# Patient Record
Sex: Female | Born: 1980 | Hispanic: No | Marital: Married | Smoking: Never smoker
Health system: Southern US, Community
[De-identification: ages and names within clinical notes are randomized; demographics above are authoritative.]

## PROBLEM LIST (undated history)

## (undated) DIAGNOSIS — C8590 Non-Hodgkin lymphoma, unspecified, unspecified site: Secondary | ICD-10-CM

## (undated) DIAGNOSIS — C801 Malignant (primary) neoplasm, unspecified: Secondary | ICD-10-CM

## (undated) HISTORY — PX: TUMOR REMOVAL: SHX12

---

## 1999-08-13 ENCOUNTER — Other Ambulatory Visit: Admission: RE | Admit: 1999-08-13 | Discharge: 1999-08-13 | Payer: Self-pay | Admitting: Family Medicine

## 2008-02-13 ENCOUNTER — Emergency Department: Payer: Self-pay | Admitting: Emergency Medicine

## 2008-04-09 ENCOUNTER — Emergency Department: Payer: Self-pay | Admitting: Emergency Medicine

## 2009-07-23 ENCOUNTER — Emergency Department: Payer: Self-pay | Admitting: Emergency Medicine

## 2010-02-06 ENCOUNTER — Emergency Department: Payer: Self-pay | Admitting: Emergency Medicine

## 2010-09-22 ENCOUNTER — Emergency Department (HOSPITAL_COMMUNITY)
Admission: EM | Admit: 2010-09-22 | Discharge: 2010-09-22 | Payer: Self-pay | Source: Home / Self Care | Admitting: Emergency Medicine

## 2010-10-23 ENCOUNTER — Encounter: Payer: Self-pay | Admitting: Obstetrics & Gynecology

## 2010-10-23 ENCOUNTER — Ambulatory Visit
Admission: RE | Admit: 2010-10-23 | Discharge: 2010-10-23 | Payer: Self-pay | Source: Home / Self Care | Attending: Obstetrics & Gynecology | Admitting: Obstetrics & Gynecology

## 2010-10-23 LAB — CONVERTED CEMR LAB: TSH: 3.21 microintl units/mL (ref 0.350–4.500)

## 2010-12-26 ENCOUNTER — Ambulatory Visit: Payer: Self-pay | Admitting: Obstetrics & Gynecology

## 2011-01-17 ENCOUNTER — Ambulatory Visit: Payer: Self-pay | Admitting: Obstetrics & Gynecology

## 2011-01-22 ENCOUNTER — Ambulatory Visit: Payer: Self-pay | Admitting: Obstetrics and Gynecology

## 2012-02-18 ENCOUNTER — Emergency Department: Payer: Self-pay | Admitting: *Deleted

## 2012-02-18 LAB — CBC
HGB: 9.7 g/dL — ABNORMAL LOW (ref 12.0–16.0)
MCHC: 30.9 g/dL — ABNORMAL LOW (ref 32.0–36.0)
MCV: 70 fL — ABNORMAL LOW (ref 80–100)
RBC: 4.51 10*6/uL (ref 3.80–5.20)
RDW: 15.7 % — ABNORMAL HIGH (ref 11.5–14.5)
WBC: 8.3 10*3/uL (ref 3.6–11.0)

## 2012-02-18 LAB — URINALYSIS, COMPLETE
Bilirubin,UR: NEGATIVE
Glucose,UR: NEGATIVE mg/dL (ref 0–75)
Hyaline Cast: 1
Nitrite: NEGATIVE
Specific Gravity: 1.027 (ref 1.003–1.030)
WBC UR: 6 /HPF (ref 0–5)

## 2012-02-18 LAB — COMPREHENSIVE METABOLIC PANEL
Albumin: 3.2 g/dL — ABNORMAL LOW (ref 3.4–5.0)
Anion Gap: 7 (ref 7–16)
Bilirubin,Total: 0.2 mg/dL (ref 0.2–1.0)
Co2: 28 mmol/L (ref 21–32)
Creatinine: 0.82 mg/dL (ref 0.60–1.30)
EGFR (Non-African Amer.): >60
Osmolality: 277 (ref 275–301)
Potassium: 3.9 mmol/L (ref 3.5–5.1)
SGPT (ALT): 16 U/L
Sodium: 139 mmol/L (ref 136–145)

## 2012-03-15 ENCOUNTER — Emergency Department: Payer: Self-pay | Admitting: Emergency Medicine

## 2012-03-15 LAB — BASIC METABOLIC PANEL
Anion Gap: 12 (ref 7–16)
Calcium, Total: 9.1 mg/dL (ref 8.5–10.1)
Co2: 23 mmol/L (ref 21–32)
EGFR (Non-African Amer.): >60
Sodium: 140 mmol/L (ref 136–145)

## 2012-03-15 LAB — CBC WITH DIFFERENTIAL/PLATELET
Basophil #: 0.1 10*3/uL (ref 0.0–0.1)
Basophil %: 0.7 %
Eosinophil #: 0.1 10*3/uL (ref 0.0–0.7)
Eosinophil %: 1.5 %
HCT: 33.8 % — ABNORMAL LOW (ref 35.0–47.0)
HGB: 10.7 g/dL — ABNORMAL LOW (ref 12.0–16.0)
Lymphocyte #: 2.3 10*3/uL (ref 1.0–3.6)
Lymphocyte %: 24.4 %
MCV: 68 fL — ABNORMAL LOW (ref 80–100)
Monocyte #: 0.8 x10 3/mm (ref 0.2–0.9)
Monocyte %: 8.8 %
Neutrophil #: 6.1 10*3/uL (ref 1.4–6.5)
Neutrophil %: 64.6 %
Platelet: 346 10*3/uL (ref 150–440)
RDW: 16.7 % — ABNORMAL HIGH (ref 11.5–14.5)
WBC: 9.4 10*3/uL (ref 3.6–11.0)

## 2012-03-21 LAB — CULTURE, BLOOD (SINGLE)

## 2012-10-12 ENCOUNTER — Emergency Department: Payer: Self-pay | Admitting: Emergency Medicine

## 2013-05-12 ENCOUNTER — Inpatient Hospital Stay: Payer: Self-pay | Admitting: Internal Medicine

## 2013-05-12 LAB — CBC
HGB: 11 g/dL — ABNORMAL LOW (ref 12.0–16.0)
MCH: 24.6 pg — ABNORMAL LOW (ref 26.0–34.0)
WBC: 1.9 10*3/uL — CL (ref 3.6–11.0)

## 2013-05-12 LAB — BASIC METABOLIC PANEL
Anion Gap: 7 (ref 7–16)
BUN: 12 mg/dL (ref 7–18)
Co2: 25 mmol/L (ref 21–32)
Creatinine: 0.71 mg/dL (ref 0.60–1.30)
EGFR (African American): >60
EGFR (Non-African Amer.): >60
Glucose: 95 mg/dL (ref 65–99)
Potassium: 4 mmol/L (ref 3.5–5.1)
Sodium: 138 mmol/L (ref 136–145)

## 2013-05-12 LAB — CK TOTAL AND CKMB (NOT AT ARMC): CK-MB: <0.5 ng/mL — ABNORMAL LOW (ref 0.5–3.6)

## 2013-05-13 DIAGNOSIS — I499 Cardiac arrhythmia, unspecified: Secondary | ICD-10-CM

## 2013-05-13 LAB — CBC WITH DIFFERENTIAL/PLATELET
Basophil #: 0 10*3/uL (ref 0.0–0.1)
Basophil #: 0 10*3/uL (ref 0.0–0.1)
Basophil %: 0.8 %
Eosinophil #: 0.1 10*3/uL (ref 0.0–0.7)
Eosinophil #: 0.1 10*3/uL (ref 0.0–0.7)
Eosinophil %: 9 %
Eosinophil %: 10 %
HGB: 9.1 g/dL — ABNORMAL LOW (ref 12.0–16.0)
HGB: 8.8 g/dL — ABNORMAL LOW (ref 12.0–16.0)
Lymphocyte #: 0.2 10*3/uL — ABNORMAL LOW (ref 1.0–3.6)
Lymphocyte %: 27.2 %
Lymphocyte %: 24.7 %
MCH: 24.7 pg — ABNORMAL LOW (ref 26.0–34.0)
MCH: 24.7 pg — ABNORMAL LOW (ref 26.0–34.0)
MCHC: 33 g/dL (ref 32.0–36.0)
MCV: 75 fL — ABNORMAL LOW (ref 80–100)
MCV: 75 fL — ABNORMAL LOW (ref 80–100)
Monocyte #: 0 x10 3/mm — ABNORMAL LOW (ref 0.2–0.9)
Monocyte %: 3.3 %
Monocyte %: 1.9 %
Neutrophil #: 0.5 10*3/uL — ABNORMAL LOW (ref 1.4–6.5)
Neutrophil #: 0.5 10*3/uL — ABNORMAL LOW (ref 1.4–6.5)
Neutrophil %: 62.5 %
Platelet: 235 10*3/uL (ref 150–440)
Platelet: 207 10*3/uL (ref 150–440)
RBC: 3.57 10*6/uL — ABNORMAL LOW (ref 3.80–5.20)
WBC: 0.9 10*3/uL — CL (ref 3.6–11.0)
WBC: 0.8 10*3/uL — CL (ref 3.6–11.0)

## 2013-05-13 LAB — COMPREHENSIVE METABOLIC PANEL
Albumin: 2.5 g/dL — ABNORMAL LOW (ref 3.4–5.0)
Alkaline Phosphatase: 91 U/L (ref 50–136)
Anion Gap: 8 (ref 7–16)
Bilirubin,Total: 0.5 mg/dL (ref 0.2–1.0)
Calcium, Total: 8.7 mg/dL (ref 8.5–10.1)
Co2: 24 mmol/L (ref 21–32)
EGFR (Non-African Amer.): >60
Glucose: 125 mg/dL — ABNORMAL HIGH (ref 65–99)
Potassium: 3.5 mmol/L (ref 3.5–5.1)
Sodium: 135 mmol/L — ABNORMAL LOW (ref 136–145)

## 2013-05-13 LAB — TSH: Thyroid Stimulating Horm: 3.76 u[IU]/mL

## 2013-05-13 LAB — MAGNESIUM: Magnesium: 1.6 mg/dL — ABNORMAL LOW

## 2013-05-13 LAB — VANCOMYCIN, TROUGH: Vancomycin, Trough: 15 ug/mL (ref 10–20)

## 2013-05-14 LAB — CBC WITH DIFFERENTIAL/PLATELET
Basophil %: 1.6 %
HCT: 25.7 % — ABNORMAL LOW (ref 35.0–47.0)
Lymphocyte #: 0.2 10*3/uL — ABNORMAL LOW (ref 1.0–3.6)
Lymphocyte %: 25.2 %
MCHC: 33.3 g/dL (ref 32.0–36.0)
Monocyte %: 2.4 %
Neutrophil #: 0.5 10*3/uL — ABNORMAL LOW (ref 1.4–6.5)
Neutrophil %: 58.4 %
Platelet: 218 10*3/uL (ref 150–440)
RBC: 3.47 10*6/uL — ABNORMAL LOW (ref 3.80–5.20)
WBC: 0.8 10*3/uL — CL (ref 3.6–11.0)

## 2013-05-14 LAB — VANCOMYCIN, TROUGH: Vancomycin, Trough: 21 ug/mL (ref 10–20)

## 2013-05-15 LAB — CBC WITH DIFFERENTIAL/PLATELET
Basophil #: 0 10*3/uL (ref 0.0–0.1)
HCT: 29.6 % — ABNORMAL LOW (ref 35.0–47.0)
Lymphocyte #: 0.2 10*3/uL — ABNORMAL LOW (ref 1.0–3.6)
Lymphocyte %: 26.5 %
MCH: 24 pg — ABNORMAL LOW (ref 26.0–34.0)
MCV: 74 fL — ABNORMAL LOW (ref 80–100)
Neutrophil #: 0.5 10*3/uL — ABNORMAL LOW (ref 1.4–6.5)
RBC: 3.99 10*6/uL (ref 3.80–5.20)
RDW: 15.5 % — ABNORMAL HIGH (ref 11.5–14.5)
WBC: 0.8 10*3/uL — CL (ref 3.6–11.0)

## 2013-05-16 LAB — CBC WITH DIFFERENTIAL/PLATELET
Basophil #: 0 10*3/uL (ref 0.0–0.1)
Basophil %: 1.5 %
Eosinophil #: 0.1 10*3/uL (ref 0.0–0.7)
Eosinophil %: 5.6 %
HGB: 8.3 g/dL — ABNORMAL LOW (ref 12.0–16.0)
Lymphocyte #: 0.2 10*3/uL — ABNORMAL LOW (ref 1.0–3.6)
Lymphocyte %: 23.5 %
MCH: 24.5 pg — ABNORMAL LOW (ref 26.0–34.0)
MCV: 74 fL — ABNORMAL LOW (ref 80–100)
Neutrophil %: 53.1 %
Platelet: 158 10*3/uL (ref 150–440)
RBC: 3.4 10*6/uL — ABNORMAL LOW (ref 3.80–5.20)
RDW: 15.1 % — ABNORMAL HIGH (ref 11.5–14.5)
WBC: 1 10*3/uL — CL (ref 3.6–11.0)

## 2013-05-16 LAB — CLOSTRIDIUM DIFFICILE BY PCR

## 2013-05-16 LAB — CREATININE, SERUM
EGFR (African American): >60
EGFR (Non-African Amer.): >60

## 2013-05-17 LAB — CBC WITH DIFFERENTIAL/PLATELET
Eosinophil #: 0 10*3/uL (ref 0.0–0.7)
HGB: 9.3 g/dL — ABNORMAL LOW (ref 12.0–16.0)
Lymphocyte #: 0.3 10*3/uL — ABNORMAL LOW (ref 1.0–3.6)
MCH: 25.1 pg — ABNORMAL LOW (ref 26.0–34.0)
Platelet: 115 10*3/uL — ABNORMAL LOW (ref 150–440)
WBC: 1.4 10*3/uL — CL (ref 3.6–11.0)

## 2013-05-17 LAB — CREATININE, SERUM
Creatinine: 1.79 mg/dL — ABNORMAL HIGH (ref 0.60–1.30)
EGFR (African American): 42 — ABNORMAL LOW
EGFR (Non-African Amer.): 36 — ABNORMAL LOW

## 2013-05-17 LAB — CULTURE, BLOOD (SINGLE)

## 2013-05-18 LAB — CBC WITH DIFFERENTIAL/PLATELET
Basophil #: 0 10*3/uL (ref 0.0–0.1)
Eosinophil #: 0.1 10*3/uL (ref 0.0–0.7)
Eosinophil %: 3 %
HGB: 9.7 g/dL — ABNORMAL LOW (ref 12.0–16.0)
Lymphocyte %: 12.6 %
Lymphocytes: 15 %
MCH: 24.7 pg — ABNORMAL LOW (ref 26.0–34.0)
MCHC: 33.2 g/dL (ref 32.0–36.0)
MCHC: 33.4 g/dL (ref 32.0–36.0)
MCV: 75 fL — ABNORMAL LOW (ref 80–100)
Monocyte #: 1.3 x10 3/mm — ABNORMAL HIGH (ref 0.2–0.9)
Monocyte %: 56.2 %
Monocytes: 46 %
Platelet: 70 10*3/uL — ABNORMAL LOW (ref 150–440)
RBC: 3.78 10*6/uL — ABNORMAL LOW (ref 3.80–5.20)
RDW: 15.7 % — ABNORMAL HIGH (ref 11.5–14.5)
RDW: 15.5 % — ABNORMAL HIGH (ref 11.5–14.5)
WBC: 2.2 10*3/uL — ABNORMAL LOW (ref 3.6–11.0)
WBC: 2.3 10*3/uL — ABNORMAL LOW (ref 3.6–11.0)

## 2013-05-18 LAB — LACTATE DEHYDROGENASE: LDH: 385 U/L — ABNORMAL HIGH (ref 81–246)

## 2013-05-18 LAB — COMPREHENSIVE METABOLIC PANEL
Albumin: 2.3 g/dL — ABNORMAL LOW (ref 3.4–5.0)
Alkaline Phosphatase: 171 U/L — ABNORMAL HIGH (ref 50–136)
Anion Gap: 9 (ref 7–16)
Calcium, Total: 9.3 mg/dL (ref 8.5–10.1)
Chloride: 105 mmol/L (ref 98–107)
Co2: 26 mmol/L (ref 21–32)
EGFR (African American): 30 — ABNORMAL LOW
EGFR (Non-African Amer.): 26 — ABNORMAL LOW
SGOT(AST): 66 U/L — ABNORMAL HIGH (ref 15–37)
SGPT (ALT): 45 U/L (ref 12–78)

## 2013-05-18 LAB — CREATININE, SERUM
Creatinine: 2.31 mg/dL — ABNORMAL HIGH (ref 0.60–1.30)
EGFR (African American): 31 — ABNORMAL LOW
EGFR (Non-African Amer.): 27 — ABNORMAL LOW

## 2013-05-19 LAB — CBC WITH DIFFERENTIAL/PLATELET
Basophil #: 0 10*3/uL (ref 0.0–0.1)
HGB: 9.7 g/dL — ABNORMAL LOW (ref 12.0–16.0)
Lymphocyte #: 0.4 10*3/uL — ABNORMAL LOW (ref 1.0–3.6)
Lymphocyte %: 17.1 %
MCHC: 33.5 g/dL (ref 32.0–36.0)
MCV: 74 fL — ABNORMAL LOW (ref 80–100)
Monocyte #: 1.4 x10 3/mm — ABNORMAL HIGH (ref 0.2–0.9)
Monocyte %: 55.5 %
Platelet: 81 10*3/uL — ABNORMAL LOW (ref 150–440)
RBC: 3.91 10*6/uL (ref 3.80–5.20)
RDW: 15.4 % — ABNORMAL HIGH (ref 11.5–14.5)
WBC: 2.6 10*3/uL — ABNORMAL LOW (ref 3.6–11.0)

## 2013-05-19 LAB — BASIC METABOLIC PANEL
Anion Gap: 6 — ABNORMAL LOW (ref 7–16)
BUN: 10 mg/dL (ref 7–18)
Chloride: 108 mmol/L — ABNORMAL HIGH (ref 98–107)
Co2: 28 mmol/L (ref 21–32)
Creatinine: 2.45 mg/dL — ABNORMAL HIGH (ref 0.60–1.30)
Glucose: 107 mg/dL — ABNORMAL HIGH (ref 65–99)
Osmolality: 283 (ref 275–301)
Potassium: 3.8 mmol/L (ref 3.5–5.1)

## 2013-05-20 ENCOUNTER — Ambulatory Visit: Payer: Self-pay | Admitting: Oncology

## 2013-05-20 LAB — CBC WITH DIFFERENTIAL/PLATELET
HCT: 27.3 % — ABNORMAL LOW (ref 35.0–47.0)
HGB: 9.1 g/dL — ABNORMAL LOW (ref 12.0–16.0)
Lymphocytes: 12 %
MCHC: 33.3 g/dL (ref 32.0–36.0)
MCV: 74 fL — ABNORMAL LOW (ref 80–100)
Monocytes: 54 %
Myelocyte: 1 %
Platelet: 96 10*3/uL — ABNORMAL LOW (ref 150–440)
RBC: 3.71 10*6/uL — ABNORMAL LOW (ref 3.80–5.20)
RDW: 15.4 % — ABNORMAL HIGH (ref 11.5–14.5)
WBC: 2.6 10*3/uL — ABNORMAL LOW (ref 3.6–11.0)

## 2013-05-20 LAB — BASIC METABOLIC PANEL
Co2: 24 mmol/L (ref 21–32)
Creatinine: 2.23 mg/dL — ABNORMAL HIGH (ref 0.60–1.30)
EGFR (Non-African Amer.): 28 — ABNORMAL LOW
Osmolality: 282 (ref 275–301)
Sodium: 141 mmol/L (ref 136–145)

## 2013-05-21 LAB — CBC WITH DIFFERENTIAL/PLATELET
Bands: 1 %
HGB: 9.2 g/dL — ABNORMAL LOW (ref 12.0–16.0)
Lymphocytes: 27 %
MCH: 24.8 pg — ABNORMAL LOW (ref 26.0–34.0)
Monocytes: 30 %
Platelet: 135 10*3/uL — ABNORMAL LOW (ref 150–440)
Segmented Neutrophils: 42 %
WBC: 2.6 10*3/uL — ABNORMAL LOW (ref 3.6–11.0)

## 2013-05-21 LAB — COMPREHENSIVE METABOLIC PANEL
Alkaline Phosphatase: 196 U/L — ABNORMAL HIGH (ref 50–136)
Anion Gap: 8 (ref 7–16)
BUN: 10 mg/dL (ref 7–18)
Chloride: 110 mmol/L — ABNORMAL HIGH (ref 98–107)
Co2: 24 mmol/L (ref 21–32)
EGFR (African American): 30 — ABNORMAL LOW
EGFR (Non-African Amer.): 26 — ABNORMAL LOW
Glucose: 128 mg/dL — ABNORMAL HIGH (ref 65–99)
Osmolality: 284 (ref 275–301)
Potassium: 3.7 mmol/L (ref 3.5–5.1)
SGOT(AST): 85 U/L — ABNORMAL HIGH (ref 15–37)

## 2013-05-22 LAB — CBC WITH DIFFERENTIAL/PLATELET
Basophil #: 0 10*3/uL (ref 0.0–0.1)
Eosinophil #: 0.1 10*3/uL (ref 0.0–0.7)
Lymphocyte #: 0.3 10*3/uL — ABNORMAL LOW (ref 1.0–3.6)
MCH: 24.8 pg — ABNORMAL LOW (ref 26.0–34.0)
MCV: 74 fL — ABNORMAL LOW (ref 80–100)
Monocyte #: 1.4 x10 3/mm — ABNORMAL HIGH (ref 0.2–0.9)
Monocyte %: 49.3 %
Neutrophil #: 1 10*3/uL — ABNORMAL LOW (ref 1.4–6.5)
Neutrophil %: 36.1 %
Platelet: 170 10*3/uL (ref 150–440)

## 2013-05-22 LAB — BASIC METABOLIC PANEL
Anion Gap: 6 — ABNORMAL LOW (ref 7–16)
Calcium, Total: 9 mg/dL (ref 8.5–10.1)
Chloride: 110 mmol/L — ABNORMAL HIGH (ref 98–107)
Sodium: 142 mmol/L (ref 136–145)

## 2013-05-23 LAB — BASIC METABOLIC PANEL
Anion Gap: 6 — ABNORMAL LOW (ref 7–16)
BUN: 7 mg/dL (ref 7–18)
Calcium, Total: 8.7 mg/dL (ref 8.5–10.1)
Co2: 25 mmol/L (ref 21–32)
Creatinine: 2.08 mg/dL — ABNORMAL HIGH (ref 0.60–1.30)
EGFR (African American): 35 — ABNORMAL LOW
EGFR (Non-African Amer.): 30 — ABNORMAL LOW
Glucose: 126 mg/dL — ABNORMAL HIGH (ref 65–99)
Osmolality: 279 (ref 275–301)
Potassium: 3.5 mmol/L (ref 3.5–5.1)
Sodium: 140 mmol/L (ref 136–145)

## 2013-05-26 ENCOUNTER — Other Ambulatory Visit: Payer: Self-pay | Admitting: Internal Medicine

## 2013-05-26 LAB — BASIC METABOLIC PANEL
BUN: 7 mg/dL (ref 7–18)
Calcium, Total: 9.3 mg/dL (ref 8.5–10.1)
Co2: 25 mmol/L (ref 21–32)
Creatinine: 1.38 mg/dL — ABNORMAL HIGH (ref 0.60–1.30)
EGFR (African American): 58 — ABNORMAL LOW
EGFR (Non-African Amer.): 50 — ABNORMAL LOW
Osmolality: 275 (ref 275–301)
Potassium: 3.5 mmol/L (ref 3.5–5.1)
Sodium: 139 mmol/L (ref 136–145)

## 2013-06-20 ENCOUNTER — Ambulatory Visit: Payer: Self-pay | Admitting: Oncology

## 2013-07-28 ENCOUNTER — Encounter (HOSPITAL_COMMUNITY): Payer: Self-pay | Admitting: Emergency Medicine

## 2013-07-28 ENCOUNTER — Emergency Department (HOSPITAL_COMMUNITY)
Admission: EM | Admit: 2013-07-28 | Discharge: 2013-07-28 | Disposition: A | Discharge status: Home / Self Care | Payer: Medicaid Other | Source: Home / Self Care

## 2013-07-28 DIAGNOSIS — H109 Unspecified conjunctivitis: Secondary | ICD-10-CM

## 2013-07-28 DIAGNOSIS — J069 Acute upper respiratory infection, unspecified: Secondary | ICD-10-CM

## 2013-07-28 HISTORY — DX: Malignant (primary) neoplasm, unspecified: C80.1

## 2013-07-28 HISTORY — DX: Non-Hodgkin lymphoma, unspecified, unspecified site: C85.90

## 2013-07-28 MED ORDER — TETRACAINE HCL 0.5 % OP SOLN
OPHTHALMIC | Status: AC
  Filled 2013-07-28: qty 2

## 2013-07-28 MED ORDER — POLYMYXIN B-TRIMETHOPRIM 10000-0.1 UNIT/ML-% OP SOLN: 1.0000 [drp] | OPHTHALMIC | Status: DC

## 2013-07-28 MED ORDER — KETOTIFEN FUMARATE 0.025 % OP SOLN: 1.0000 [drp] | Freq: Two times a day (BID) | OPHTHALMIC | Status: DC

## 2013-07-28 NOTE — ED Notes (Signed)
Discharged by Douglass Rivers

## 2013-07-28 NOTE — ED Notes (Signed)
Reports runny nose, cough and congestion for several days prior to left eye turning red (Tuesday).  Since then redness has worsened, swelling, pain, redness and drainage.

## 2013-07-28 NOTE — ED Notes (Signed)
Eye box and tetracaine at bedside 

## 2013-07-28 NOTE — ED Provider Notes (Signed)
CSN: [id number]     Arrival date & time 07/28/13  1022 History   First MD Initiated Contact with Patient 07/28/13 1149     Chief Complaint  Patient presents with  . Conjunctivitis   (Consider location/radiation/quality/duration/timing/severity/associated sxs/prior Treatment) HPI Comments: 32 year old female has had upper respiratory congestion symptoms for several days. Approximately 2 nights ago she developed erythema and drainage to the left eye. His been getting worse over the past 2 days. She is now having upper and lower leg swelling, redness of the upper and lower lids and sclera. She also notes a green thick discharge from the eye.   Past Medical History  Diagnosis Date  . Cancer   . Lymphoma    Past Surgical History  Procedure Laterality Date  . Tumor removal     No family history on file. History  Substance Use Topics  . Smoking status: Never Smoker   . Smokeless tobacco: Not on file  . Alcohol Use: No   OB History   Grav Para Term Preterm Abortions TAB SAB Ect Mult Living                 Review of Systems  Constitutional: Negative for fever, chills, activity change, appetite change and fatigue.  HENT: Positive for congestion, postnasal drip and rhinorrhea. Negative for facial swelling.   Eyes: Positive for discharge, redness and itching. Negative for visual disturbance.  Respiratory: Negative.   Cardiovascular: Negative.   Musculoskeletal: Negative for neck pain and neck stiffness.  Skin: Negative for pallor and rash.  Neurological: Negative.     Allergies  Review of patient's allergies indicates no known allergies.  Home Medications   Current Outpatient Rx  Name  Route  Sig  Dispense  Refill  . ketotifen (ZADITOR) 0.025 % ophthalmic solution   Both Eyes   Place 1 drop into both eyes 2 (two) times daily.   5 mL   0   . trimethoprim-polymyxin b (POLYTRIM) ophthalmic solution   Left Eye   Place 1 drop into the left eye every 4 (four) hours.   10 mL   0    BP 122/83  Pulse 93  Temp(Src) 97.9 F (36.6 C) (Oral)  Resp 16  SpO2 96% Physical Exam  Nursing note and vitals reviewed. Constitutional: She is oriented to person, place, and time. She appears well-developed and well-nourished. No distress.  HENT:  Right Ear: External ear normal.  Left Ear: External ear normal.  Eyes: EOM are normal. Pupils are equal, round, and reactive to light.  Left upper and lower lid edema, conjunctival erythema and mild swelling, minor conjunctival injection. No limbal flush, anterior chamber clear.  Neck: Normal range of motion. Neck supple.  Cardiovascular: Normal rate and regular rhythm.   Pulmonary/Chest: Effort normal and breath sounds normal. No respiratory distress.  Musculoskeletal: Normal range of motion. She exhibits no edema.  Lymphadenopathy:    She has no cervical adenopathy.  Neurological: She is alert and oriented to person, place, and time.  Skin: Skin is warm and dry. No rash noted.  Psychiatric: She has a normal mood and affect.    ED Course  Procedures (including critical care time) Labs Review Labs Reviewed - No data to display Imaging Review No results found.     MDM   1. Conjunctivitis of left eye   2. URI (upper respiratory infection)      Polytrim eyedrops as directed As needed for eyedrops as directed Warm moist compresses 3-4 times a day  for 15 minutes at a time Wash hands frequently Instructions for URI symptoms.  Hayden Rasmussen, NP 07/28/13 1202

## 2013-07-29 NOTE — ED Provider Notes (Signed)
Medical screening examination/treatment/procedure(s) were performed by a resident physician or non-physician practitioner and as the supervising physician I was immediately available for consultation/collaboration.  Dallie Patton, MD    Deangelo Berns S Madisynn Plair, MD 07/29/13 0735 

## 2013-11-06 ENCOUNTER — Emergency Department (HOSPITAL_COMMUNITY): Payer: Medicaid Other

## 2013-11-06 ENCOUNTER — Encounter (HOSPITAL_COMMUNITY): Payer: Self-pay | Admitting: Emergency Medicine

## 2013-11-06 ENCOUNTER — Inpatient Hospital Stay (HOSPITAL_COMMUNITY)
Admission: EM | Admit: 2013-11-06 | Discharge: 2013-11-10 | DRG: 689 | Disposition: A | Discharge status: Home / Self Care | Payer: Medicaid Other | Attending: Internal Medicine | Admitting: Internal Medicine

## 2013-11-06 DIAGNOSIS — E876 Hypokalemia: Secondary | ICD-10-CM | POA: Diagnosis present

## 2013-11-06 DIAGNOSIS — J329 Chronic sinusitis, unspecified: Secondary | ICD-10-CM | POA: Diagnosis present

## 2013-11-06 DIAGNOSIS — N390 Urinary tract infection, site not specified: Principal | ICD-10-CM | POA: Diagnosis present

## 2013-11-06 DIAGNOSIS — C8589 Other specified types of non-Hodgkin lymphoma, extranodal and solid organ sites: Secondary | ICD-10-CM | POA: Diagnosis present

## 2013-11-06 DIAGNOSIS — A498 Other bacterial infections of unspecified site: Secondary | ICD-10-CM | POA: Diagnosis present

## 2013-11-06 DIAGNOSIS — R509 Fever, unspecified: Secondary | ICD-10-CM

## 2013-11-06 DIAGNOSIS — J189 Pneumonia, unspecified organism: Secondary | ICD-10-CM | POA: Diagnosis present

## 2013-11-06 DIAGNOSIS — Z22321 Carrier or suspected carrier of Methicillin susceptible Staphylococcus aureus: Secondary | ICD-10-CM

## 2013-11-06 DIAGNOSIS — D649 Anemia, unspecified: Secondary | ICD-10-CM | POA: Diagnosis present

## 2013-11-06 DIAGNOSIS — I871 Compression of vein: Secondary | ICD-10-CM | POA: Diagnosis present

## 2013-11-06 DIAGNOSIS — C8590 Non-Hodgkin lymphoma, unspecified, unspecified site: Secondary | ICD-10-CM | POA: Diagnosis present

## 2013-11-06 LAB — CBC WITH DIFFERENTIAL/PLATELET
BASOS ABS: 0 10*3/uL (ref 0.0–0.1)
BASOS PCT: 0 % (ref 0–1)
EOS ABS: 0.1 10*3/uL (ref 0.0–0.7)
Eosinophils Relative: 1 % (ref 0–5)
HCT: 36.5 % (ref 36.0–46.0)
HEMOGLOBIN: 11.7 g/dL — AB (ref 12.0–15.0)
Lymphocytes Relative: 26 % (ref 12–46)
Lymphs Abs: 3.3 10*3/uL (ref 0.7–4.0)
MCH: 23.8 pg — AB (ref 26.0–34.0)
MCHC: 32.1 g/dL (ref 30.0–36.0)
MCV: 74.2 fL — ABNORMAL LOW (ref 78.0–100.0)
MONO ABS: 1.5 10*3/uL — AB (ref 0.1–1.0)
Monocytes Relative: 12 % (ref 3–12)
NEUTROS PCT: 61 % (ref 43–77)
Neutro Abs: 7.7 10*3/uL (ref 1.7–7.7)
Platelets: 204 10*3/uL (ref 150–400)
RBC: 4.92 MIL/uL (ref 3.87–5.11)
RDW: 21.2 % — AB (ref 11.5–15.5)
WBC: 12.6 10*3/uL — ABNORMAL HIGH (ref 4.0–10.5)

## 2013-11-06 LAB — URINALYSIS, ROUTINE W REFLEX MICROSCOPIC
Bilirubin Urine: NEGATIVE
Glucose, UA: NEGATIVE mg/dL
Ketones, ur: NEGATIVE mg/dL
NITRITE: NEGATIVE
PH: 7.5 (ref 5.0–8.0)
Protein, ur: 100 mg/dL — AB
SPECIFIC GRAVITY, URINE: 1.019 (ref 1.005–1.030)
UROBILINOGEN UA: 2 mg/dL — AB (ref 0.0–1.0)

## 2013-11-06 LAB — COMPREHENSIVE METABOLIC PANEL
ALT: 6 U/L (ref 0–35)
AST: 10 U/L (ref 0–37)
Albumin: 3.1 g/dL — ABNORMAL LOW (ref 3.5–5.2)
Alkaline Phosphatase: 85 U/L (ref 39–117)
BILIRUBIN TOTAL: 1 mg/dL (ref 0.3–1.2)
BUN: 7 mg/dL (ref 6–23)
CALCIUM: 9 mg/dL (ref 8.4–10.5)
CHLORIDE: 101 meq/L (ref 96–112)
CO2: 22 mEq/L (ref 19–32)
Creatinine, Ser: 0.67 mg/dL (ref 0.50–1.10)
GFR calc Af Amer: >90 mL/min (ref >90)
Glucose, Bld: 86 mg/dL (ref 70–99)
Potassium: 3.5 mEq/L — ABNORMAL LOW (ref 3.7–5.3)
Sodium: 139 mEq/L (ref 137–147)
Total Protein: 5.8 g/dL — ABNORMAL LOW (ref 6.0–8.3)

## 2013-11-06 LAB — PREGNANCY, URINE: PREG TEST UR: NEGATIVE

## 2013-11-06 LAB — URINE MICROSCOPIC-ADD ON

## 2013-11-06 LAB — CG4 I-STAT (LACTIC ACID): LACTIC ACID, VENOUS: 1.13 mmol/L (ref 0.5–2.2)

## 2013-11-06 MED ORDER — PIPERACILLIN-TAZOBACTAM 3.375 G IVPB
3.3750 g | Freq: Once | INTRAVENOUS | Status: AC
  Administered 2013-11-06: 3.375 g via INTRAVENOUS
  Filled 2013-11-06: qty 50

## 2013-11-06 MED ORDER — VANCOMYCIN HCL IN DEXTROSE 1-5 GM/200ML-% IV SOLN
1000.0000 mg | Freq: Once | INTRAVENOUS | Status: AC
  Administered 2013-11-07: 1000 mg via INTRAVENOUS
  Filled 2013-11-06: qty 200

## 2013-11-06 MED ORDER — SODIUM CHLORIDE 0.9 % IV BOLUS (SEPSIS)
1000.0000 mL | Freq: Once | INTRAVENOUS | Status: AC
  Administered 2013-11-06: 1000 mL via INTRAVENOUS

## 2013-11-06 NOTE — ED Notes (Signed)
Pt states shes been having a headache, left ear pain, and swelling to left side of face

## 2013-11-06 NOTE — ED Provider Notes (Addendum)
CSN: [id number]     Arrival date & time 11/06/13  1717 History   First MD Initiated Contact with Patient 11/06/13 2131     Chief Complaint  Patient presents with  . Headache  . Nasal Congestion  . Fever   (Consider location/radiation/quality/duration/timing/severity/associated sxs/prior Treatment) HPI Comments: Patient has history of Hodgkin's lymphoma on by mouth chemotherapy supervised by Dr. Romilda Garret at [facility]. She presents with a one-day history of headache, congestion, fatigue, cough and fever to 101 at home. Cough is productive of yellow mucus which she says is typical of her lymphoma She denies any nausea, vomiting or abdominal pain. No chest pain. She notes gradual onset headache with body aches and chills. No focal weakness, numbness or tingling. Her voice is hoarse at baseline.  The history is provided by the patient.    Past Medical History  Diagnosis Date  . Cancer   . Lymphoma    Past Surgical History  Procedure Laterality Date  . Tumor removal     Family History  Problem Relation Age of Onset  . Leukemia Mother   . Colon cancer Father   . Diabetes Mellitus II Other    History  Substance Use Topics  . Smoking status: Never Smoker   . Smokeless tobacco: Not on file  . Alcohol Use: No   OB History   Grav Para Term Preterm Abortions TAB SAB Ect Mult Living                 Review of Systems  Constitutional: Positive for fever, activity change, appetite change and fatigue.  HENT: Positive for congestion and rhinorrhea. Negative for sore throat.   Respiratory: Positive for cough. Negative for shortness of breath.   Cardiovascular: Negative for chest pain.  Gastrointestinal: Negative for nausea, vomiting and abdominal pain.  Genitourinary: Negative for dysuria and hematuria.  Musculoskeletal: Positive for arthralgias and myalgias.  Skin: Negative for rash.  Neurological: Positive for weakness and headaches. Negative for dizziness.  A complete 10 system review of  systems was obtained and all systems are negative except as noted in the HPI and PMH.    Allergies  Review of patient's allergies indicates no known allergies.  Home Medications   Current Outpatient Rx  Name  Route  Sig  Dispense  Refill  . OxyCODONE (OXYCONTIN) 15 mg T12A 12 hr tablet   Oral   Take 15 mg by mouth every 12 (twelve) hours.         . Oxycodone HCl 10 MG TABS   Oral   Take 10 mg by mouth 4 (four) times daily as needed (for breakthrough pain).         Marland Kitchen PRESCRIPTION MEDICATION   Oral   Take 4 capsules by mouth daily. Chemo medication from [facility]--total daily dose 560 mg ( four 140 mg capsules)          BP 112/69  Pulse 111  Temp(Src) 98.8 F (37.1 C) (Oral)  Resp 22  Ht 6\' 2"  (1.88 m)  Wt 260 lb (117.935 kg)  BMI 33.37 kg/m2  SpO2 94%  LMP 09/06/2013 Physical Exam  Constitutional: She is oriented to person, place, and time. She appears well-developed and well-nourished. No distress.  HENT:  Head: Normocephalic and atraumatic.  Mouth/Throat: Oropharynx is clear and moist. No oropharyngeal exudate.  Hoarse voice  Eyes: Conjunctivae and EOM are normal. Pupils are equal, round, and reactive to light.  Neck: Normal range of motion. Neck supple.  Cardiovascular: Normal rate, regular  rhythm and normal heart sounds.   No murmur heard. Pulmonary/Chest: Effort normal and breath sounds normal. No respiratory distress.  Port-A-Cath right chest tachycardic  Abdominal: There is no tenderness. There is no rebound and no guarding.  Musculoskeletal: Normal range of motion. She exhibits no edema and no tenderness.  Neurological: She is alert and oriented to person, place, and time. No cranial nerve deficit. She exhibits normal muscle tone. Coordination normal.  Skin: Skin is warm.    ED Course  Procedures (including critical care time) Labs Review Labs Reviewed  CBC WITH DIFFERENTIAL - Abnormal; Notable for the following:    WBC 12.6 (*)    Hemoglobin 11.7  (*)    MCV 74.2 (*)    MCH 23.8 (*)    RDW 21.2 (*)    Monocytes Absolute 1.5 (*)    All other components within normal limits  COMPREHENSIVE METABOLIC PANEL - Abnormal; Notable for the following:    Potassium 3.5 (*)    Total Protein 5.8 (*)    Albumin 3.1 (*)    All other components within normal limits  URINALYSIS, ROUTINE W REFLEX MICROSCOPIC - Abnormal; Notable for the following:    APPearance CLOUDY (*)    Hgb urine dipstick TRACE (*)    Protein, ur 100 (*)    Urobilinogen, UA 2.0 (*)    Leukocytes, UA LARGE (*)    All other components within normal limits  URINE MICROSCOPIC-ADD ON - Abnormal; Notable for the following:    Bacteria, UA MANY (*)    All other components within normal limits  CULTURE, BLOOD (ROUTINE X 2)  CULTURE, BLOOD (ROUTINE X 2)  URINE CULTURE  PREGNANCY, URINE  CG4 I-STAT (LACTIC ACID)   Imaging Review Dg Chest 2 View  11/06/2013   CLINICAL DATA:  History of lymphoma.  Headache  EXAM: CHEST  2 VIEW  COMPARISON:  None.  FINDINGS: There is a port in the right chest wall. Normal cardiac silhouette. There are bilateral perihilar pulmonary masses. Bilateral small pleural effusions. No pneumothorax.  IMPRESSION: Bilateral perihilar pulmonary masses represent either multifocal pneumonia or lymphomatous involvement of the pulmonary parenchyma.  Small effusions.   Electronically Signed   By: Suzy Bouchard M.D.   On: 11/06/2013 18:35   Ct Head Wo Contrast  11/06/2013   CLINICAL DATA:  Headache; nasal congestion and fever.  EXAM: CT HEAD WITHOUT CONTRAST  TECHNIQUE: Contiguous axial images were obtained from the base of the skull through the vertex without intravenous contrast.  COMPARISON:  None.  FINDINGS: There is no evidence of acute infarction, mass lesion, or intra- or extra-axial hemorrhage on CT.  The posterior fossa, including the cerebellum, brainstem and fourth ventricle, is within normal limits. The third and lateral ventricles, and basal ganglia are  unremarkable in appearance. The cerebral hemispheres are symmetric in appearance, with normal gray-white differentiation. No mass effect or midline shift is seen.  There is no evidence of fracture; visualized osseous structures are unremarkable in appearance. The orbits are within normal limits. There is near complete opacification of the left mastoid air cells, and mucoperiosteal thickening is seen within the maxillary sinuses and left side of the sphenoid sinus. The remaining paranasal sinuses and right mastoid air cells are well-aerated. No significant soft tissue abnormalities are seen.  IMPRESSION: 1. No acute intracranial pathology seen on CT. 2. Near complete opacification of the left mastoid air cells, and mucoperiosteal thickening within the maxillary sinuses and at the left side of the sphenoid sinus.  Electronically Signed   By: Garald Balding M.D.   On: 11/06/2013 23:09   Ct Angio Chest Pe W/cm &/or Wo Cm  11/07/2013   CLINICAL DATA:  Headache, fatigue, congestion, facial swelling and productive cough.  EXAM: CT ANGIOGRAPHY CHEST WITH CONTRAST  TECHNIQUE: Multidetector CT imaging of the chest was performed using the standard protocol during bolus administration of intravenous contrast. Multiplanar CT image reconstructions including MIPs were obtained to evaluate the vascular anatomy.  CONTRAST:  167mL OMNIPAQUE IOHEXOL 350 MG/ML SOLN  COMPARISON:  Chest radiograph performed 11/06/2013  FINDINGS: There is no evidence of central pulmonary embolus. Evaluation for pulmonary embolus is significantly suboptimal due to filling of collateral vessels along the thoracic and lumbar spine.  Multiple large masslike opacities are seen in a relatively central distribution bilaterally, with adjacent airspace consolidation. Given the patient's history of lymphoma, this may reflect lymphomatous involvement of the pulmonary parenchyma, or an unusual appearance to multifocal pneumonia. Metastatic disease from another  primary is considered less likely. Associated small to moderate bilateral pleural effusions are seen, with mild loculation. There is no evidence of pneumothorax.  A trace pericardial effusion is noted. Mild edema is noted within the pericardial fat. Visualized mediastinal nodes remain borderline normal in size. The great vessels are grossly unremarkable in appearance. No axillary lymphadenopathy is seen. The visualized portions of the thyroid gland are unremarkable in appearance. A right-sided chest port is seen. 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  The visualized portions of the liver and spleen are unremarkable. Prominent collateral vessels are seen along the anterior abdominal wall.  Note is made of a large 7.1 x 3.2 x 5.3 cm centrally hypoattenuating mass within the right chest wall, infiltrating into the right pectoralis major muscle. Given the patient's history, this is thought most likely to reflect lymphoma.  No acute osseous abnormalities are seen.  Review of the MIP images confirms the above findings.  IMPRESSION: 1. No evidence of central pulmonary embolus. 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 2. Multiple large masslike opacities in a relatively central distribution bilaterally, with adjacent airspace consolidation. Given the patient's history of lymphoma, this may reflect lymphomatous involvement of the pulmonary parenchyma, or an unusual appearance to multifocal pneumonia. Metastatic disease from another primary malignancy is considered less likely. 3. Associated small to moderate bilateral pleural effusions seen, with mild loculation. 4. Mild edema within the pericardial fat; trace pericardial effusion seen. 5. Unusual filling of collateral vessels along the thoracic and lumbar spine, and  mild dilatation of the azygos vein, with prominent collateral vessels along the anterior abdominal wall. 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 6. Large 7.1 x 3.5 x 5.3 cm centrally hypoattenuating mass within the right chest wall, infiltrating into the right pectoralis major muscle. Given the patient's history, this is thought most likely to reflect lymphoma, though malignancy of other primary origin could have a similar appearance.   Electronically Signed   By: Garald Balding M.D.   On: 11/07/2013 01:03    EKG Interpretation   None       MDM   1. Urinary tract infection   2. Fever   3. Lymphoma    Body aches, cough, congestion, fevers with history of Hodgkin's lymphoma. Patient does not appear  to be toxic. She is tachycardic.  Chest x-ray shows pulmonary masses which may represent pneumonia or lymphoma. No hypoxia. UA appears infected. Patient started on broad-spectrum antibiotics. Blood cultures obtained. No neutropenia Given patient's history she'll be admitted for IV antibiotics and monitoring. D/w Dr. Roel Cluck.  With abnormal chest xray and tachycardia, will obtain CT chest. No evidence of PE. Multiple changes consistent with lymphoma. Possible SVC occlusion d.w Dr. Julien Nordmann who agrees with medical admission and oncology to consult tomorrow.  Ezequiel Essex, MD 11/07/13 4680  Ezequiel Essex, MD 11/07/13 [phone]

## 2013-11-06 NOTE — ED Notes (Signed)
Pt presents to department for evaluation of headache, congestion, fatigue, facial swelling and productive cough. Upon arrival respirations unlabored. Pt states she is coughing up yellow thick mucus. Pt is conscious alert and oriented x4.

## 2013-11-07 ENCOUNTER — Emergency Department (HOSPITAL_COMMUNITY): Payer: Medicaid Other

## 2013-11-07 ENCOUNTER — Encounter (HOSPITAL_COMMUNITY): Payer: Self-pay | Admitting: Radiology

## 2013-11-07 DIAGNOSIS — C8589 Other specified types of non-Hodgkin lymphoma, extranodal and solid organ sites: Secondary | ICD-10-CM

## 2013-11-07 DIAGNOSIS — J189 Pneumonia, unspecified organism: Secondary | ICD-10-CM | POA: Diagnosis present

## 2013-11-07 DIAGNOSIS — R509 Fever, unspecified: Secondary | ICD-10-CM

## 2013-11-07 DIAGNOSIS — E876 Hypokalemia: Secondary | ICD-10-CM

## 2013-11-07 DIAGNOSIS — N390 Urinary tract infection, site not specified: Principal | ICD-10-CM

## 2013-11-07 DIAGNOSIS — J329 Chronic sinusitis, unspecified: Secondary | ICD-10-CM | POA: Diagnosis present

## 2013-11-07 DIAGNOSIS — I871 Compression of vein: Secondary | ICD-10-CM | POA: Diagnosis present

## 2013-11-07 LAB — EXPECTORATED SPUTUM ASSESSMENT W REFEX TO RESP CULTURE

## 2013-11-07 LAB — EXPECTORATED SPUTUM ASSESSMENT W GRAM STAIN, RFLX TO RESP C

## 2013-11-07 LAB — INFLUENZA PANEL BY PCR (TYPE A & B)
H1N1 flu by pcr: NOT DETECTED
Influenza A By PCR: NEGATIVE
Influenza B By PCR: NEGATIVE

## 2013-11-07 LAB — STREP PNEUMONIAE URINARY ANTIGEN: Strep Pneumo Urinary Antigen: NEGATIVE

## 2013-11-07 MED ORDER — OSELTAMIVIR PHOSPHATE 75 MG PO CAPS
75.0000 mg | ORAL_CAPSULE | Freq: Two times a day (BID) | ORAL | Status: DC
Start: 2013-11-07 — End: 2013-11-07
  Administered 2013-11-07: 75 mg via ORAL
  Filled 2013-11-07 (×2): qty 1

## 2013-11-07 MED ORDER — FLUTICASONE PROPIONATE 50 MCG/ACT NA SUSP
2.0000 | Freq: Every day | NASAL | Status: DC
  Administered 2013-11-07 – 2013-11-10 (×4): 2 via NASAL
  Filled 2013-11-07: qty 16

## 2013-11-07 MED ORDER — VANCOMYCIN HCL 10 G IV SOLR
1250.0000 mg | Freq: Two times a day (BID) | INTRAVENOUS | Status: DC
  Administered 2013-11-07 – 2013-11-09 (×6): 1250 mg via INTRAVENOUS
  Filled 2013-11-07 (×7): qty 1250

## 2013-11-07 MED ORDER — IOHEXOL 350 MG/ML SOLN
100.0000 mL | Freq: Once | INTRAVENOUS | Status: AC | PRN
  Administered 2013-11-07: 100 mL via INTRAVENOUS

## 2013-11-07 MED ORDER — OXYCODONE HCL 5 MG PO TABS
10.0000 mg | ORAL_TABLET | Freq: Four times a day (QID) | ORAL | Status: DC | PRN
  Administered 2013-11-07 (×2): 10 mg via ORAL
  Filled 2013-11-07 (×2): qty 2

## 2013-11-07 MED ORDER — OXYCODONE HCL ER 15 MG PO T12A
15.0000 mg | EXTENDED_RELEASE_TABLET | Freq: Two times a day (BID) | ORAL | Status: DC
  Administered 2013-11-07 – 2013-11-09 (×3): 15 mg via ORAL
  Filled 2013-11-07 (×3): qty 1

## 2013-11-07 MED ORDER — POTASSIUM CHLORIDE CRYS ER 20 MEQ PO TBCR
40.0000 meq | EXTENDED_RELEASE_TABLET | Freq: Once | ORAL | Status: AC
  Administered 2013-11-07: 40 meq via ORAL
  Filled 2013-11-07: qty 2

## 2013-11-07 MED ORDER — OXYMETAZOLINE HCL 0.05 % NA SOLN
1.0000 | Freq: Two times a day (BID) | NASAL | Status: AC | PRN
  Administered 2013-11-08: 1 via NASAL
  Filled 2013-11-07: qty 15

## 2013-11-07 MED ORDER — ENOXAPARIN SODIUM 40 MG/0.4ML ~~LOC~~ SOLN
40.0000 mg | SUBCUTANEOUS | Status: DC
  Administered 2013-11-07 – 2013-11-08 (×2): 40 mg via SUBCUTANEOUS
  Filled 2013-11-07 (×2): qty 0.4

## 2013-11-07 MED ORDER — PIPERACILLIN-TAZOBACTAM 3.375 G IVPB
3.3750 g | Freq: Three times a day (TID) | INTRAVENOUS | Status: DC
  Administered 2013-11-07 – 2013-11-10 (×10): 3.375 g via INTRAVENOUS
  Filled 2013-11-07 (×14): qty 50

## 2013-11-07 MED ORDER — ONDANSETRON HCL 4 MG/2ML IJ SOLN
4.0000 mg | Freq: Four times a day (QID) | INTRAMUSCULAR | Status: DC | PRN
  Administered 2013-11-07 – 2013-11-08 (×2): 4 mg via INTRAVENOUS
  Filled 2013-11-07 (×3): qty 2

## 2013-11-07 MED ORDER — IBRUTINIB 140 MG PO CAPS
560.0000 mg | ORAL_CAPSULE | Freq: Every day | ORAL | Status: DC
Start: 2013-11-07 — End: 2013-11-07

## 2013-11-07 MED ORDER — ONDANSETRON HCL 4 MG/2ML IJ SOLN
INTRAMUSCULAR | Status: AC
Start: 2013-11-07 — End: 2013-11-07
  Filled 2013-11-07: qty 2

## 2013-11-07 MED ORDER — IBRUTINIB 140 MG PO CAPS
560.0000 mg | ORAL_CAPSULE | Freq: Every day | ORAL | Status: DC
  Administered 2013-11-07 – 2013-11-09 (×3): 560 mg via ORAL
  Filled 2013-11-07 (×2): qty 4

## 2013-11-07 MED ORDER — ONDANSETRON HCL 4 MG/2ML IJ SOLN
4.0000 mg | Freq: Once | INTRAMUSCULAR | Status: AC
  Administered 2013-11-07: 4 mg via INTRAVENOUS

## 2013-11-07 MED ORDER — ALBUTEROL SULFATE (2.5 MG/3ML) 0.083% IN NEBU
2.5000 mg | INHALATION_SOLUTION | RESPIRATORY_TRACT | Status: DC | PRN
  Administered 2013-11-07: 2.5 mg via RESPIRATORY_TRACT
  Filled 2013-11-07: qty 3

## 2013-11-07 NOTE — Progress Notes (Signed)
Utilization review completed.  

## 2013-11-07 NOTE — Progress Notes (Signed)
ANTIBIOTIC CONSULT NOTE - INITIAL  Pharmacy Consult for vancomycin Indication: rule out pneumonia  No Known Allergies  Patient Measurements: Height: 6\' 2"  (188 cm) Weight: 260 lb (117.935 kg) IBW/kg (Calculated) : 77.7  Vital Signs: Temp: 98.6 F (37 C) (01/19 0228) Temp src: Oral (01/19 0228) BP: 124/79 mmHg (01/19 0226) Pulse Rate: 109 (01/19 0226)  Labs:  Recent Labs  11/06/13 2219  WBC 12.6*  HGB 11.7*  PLT 204  CREATININE 0.67   Estimated Creatinine Clearance: 146.7 ml/min (by C-G formula based on Cr of 0.67).   Microbiology: No results found for this or any previous visit (from the past 720 hour(s)).  Medical History: Past Medical History  Diagnosis Date  . Cancer   . Lymphoma     Medications:  Prescriptions prior to admission  Medication Sig Dispense Refill  . OxyCODONE (OXYCONTIN) 15 mg T12A 12 hr tablet Take 15 mg by mouth every 12 (twelve) hours.      . Oxycodone HCl 10 MG TABS Take 10 mg by mouth 4 (four) times daily as needed (for breakthrough pain).      Marland Kitchen PRESCRIPTION MEDICATION Take 4 capsules by mouth daily. Chemo medication from [facility]--total daily dose 560 mg ( four 140 mg capsules)       Scheduled:  . enoxaparin (LOVENOX) injection  40 mg Subcutaneous Q24H  . fluticasone  2 spray Each Nare Daily  . oseltamivir  75 mg Oral BID  . OxyCODONE  15 mg Oral Q12H  . piperacillin-tazobactam (ZOSYN)  IV  3.375 g Intravenous Q8H  . potassium chloride  40 mEq Oral Once    Assessment: 33yo female w/ non-Hodgkin's lymphoma presents w/ HA and fever, on oral chemotherapy followed by [facility], CT concerning for PNA, to begin IV ABX.  Goal of Therapy:  Vancomycin trough level 15-20 mcg/ml  Plan:  Rec'd vanc 1g in ED; will continue with vancomycin 1250mg  IV Q12H and monitor CBC, Cx, levels prn.  Wynona Neat, PharmD, BCPS  11/07/2013,4:42 AM

## 2013-11-07 NOTE — Progress Notes (Signed)
Patient seen and examined. Admitted earlier today for HA, dizziness and dysuria. She has a h/o NHL diagnosed in May 2012 and followed at [facility]. She had recurrence of her disease and was put on an experimental drug that can cause HA and dizziness about 5 weeks ago. She also had a temp of 102. Cx are pending. She was started on broad spectrum abx with vanc/zosyn. Suspect source of her fever is UTI and not so much PNA. Flu PCR is negative so will DC tamiflu. Will continue to follow.  Domingo Mend, MD [facility] Pager: [phone]

## 2013-11-07 NOTE — H&P (Signed)
PCP: none Oncology: Dr. Romilda Garret at [facility]  Chief Complaint:  Fever, headache, left face swelling   HPI: Eileen Evans is a 33 y.o. female   has a past medical history of Cancer and Lymphoma.   Presented with  patient reports started to have headache this AM, tried to take a nap, found she was febrile up to 101.8. SHe has hx of non-hodgkins lymphoma followed by [facility]. She is currently on PO chemotherapy. States she has hx of Pulmonary masses that they are watching and she supposed to have repeat CT scan on 1/20 to see if her chemo has worked.  Patient reports dysuria for the past 2 days. Left facial swelling since this afternoon. Patient states she has hx of her face swelling when she starts to feel bad. . She has hx of lymph node biopsy of the neck on that side and radiation therapy to her left neck. Denies any chest pain or shortness of breath. She has hx of chronic congestion.  Hospitalist called for an admission  Review of Systems:    Pertinent positives include: Fevers, chills, fatigue, nasal congestion,  Constitutional:  No weight loss, night sweats,  weight loss  HEENT:  No headaches, Difficulty swallowing,Tooth/dental problems,Sore throat,  No sneezing, itching, ear ache, post nasal drip,  Cardio-vascular:  No chest pain, Orthopnea, PND, anasarca, dizziness, palpitations.no Bilateral lower extremity swelling  GI:  No heartburn, indigestion, abdominal pain, nausea, vomiting, diarrhea, change in bowel habits, loss of appetite, melena, blood in stool, hematemesis Resp:  no shortness of breath at rest. No dyspnea on exertion, No excess mucus, no productive cough, No non-productive cough, No coughing up of blood.No change in color of mucus.No wheezing. Skin:  no rash or lesions. No jaundice GU:  no dysuria, change in color of urine, no urgency or frequency. No straining to urinate.  No flank pain.  Musculoskeletal:  No joint pain or no joint swelling. No decreased range of  motion. No back pain.  Psych:  No change in mood or affect. No depression or anxiety. No memory loss.  Neuro: no localizing neurological complaints, no tingling, no weakness, no double vision, no gait abnormality, no slurred speech, no confusion  Otherwise ROS are negative except for above, 10 systems were reviewed  Past Medical History: Past Medical History  Diagnosis Date  . Cancer   . Lymphoma    Past Surgical History  Procedure Laterality Date  . Tumor removal       Medications: Prior to Admission medications   Medication Sig Start Date End Date Taking? Authorizing Provider  OxyCODONE (OXYCONTIN) 15 mg T12A 12 hr tablet Take 15 mg by mouth every 12 (twelve) hours.   Yes Historical Provider, MD  Oxycodone HCl 10 MG TABS Take 10 mg by mouth 4 (four) times daily as needed (for breakthrough pain).   Yes Historical Provider, MD  PRESCRIPTION MEDICATION Take 4 capsules by mouth daily. Chemo medication from [facility]--total daily dose 560 mg ( four 140 mg capsules)   Yes Historical Provider, MD    Allergies:  No Known Allergies  Social History:  Ambulatory independently  Lives at  Home with family   reports that she has never smoked. She does not have any smokeless tobacco history on file. She reports that she does not drink alcohol or use illicit drugs.   Family History: family history includes Colon cancer in her father; Diabetes Mellitus II in her other; Leukemia in her mother.    Physical Exam:  Patient Vitals for the past 24 hrs:  BP Temp Temp src Pulse Resp SpO2 Height Weight  11/07/13 0111 112/69 mmHg - - 111 22 94 % - -  11/06/13 2245 138/89 mmHg - - 115 - 90 % - -  11/06/13 2228 144/91 mmHg 98.8 F (37.1 C) Oral 122 22 98 % - -  11/06/13 2116 122/88 mmHg 99.6 F (37.6 C) Oral 118 18 100 % - -  11/06/13 1730 - - - - - - 6\' 2"  (1.88 m) 117.935 kg (260 lb)  11/06/13 1725 133/93 mmHg 99.5 F (37.5 C) - 94 18 100 % - -    1. General:  in No Acute distress 2.  Psychological: Alert and   Oriented 3. Head/ENT:   Moist  Mucous Membranes                          Head Non traumatic, neck supple, large bulky firm lymphadenopathy submandibular node left side                          Normal   Dentition 4. SKIN: normal   Skin turgor,  Skin clean Dry and intact no rash 5. Heart: Regular rate and rhythm no Murmur, Rub or gallop 6. Lungs: no wheezes or crackles,   with distant breath sounds 7. Abdomen: Soft, non-tender, Non distended 8. Lower extremities: no clubbing, cyanosis, or edema 9. Neurologically Grossly intact, moving all 4 extremities equally 10. MSK: Normal range of motion Chest wall mass noted on the right at 10:00 o'clock of the right breast body mass index is 33.37 kg/(m^2).   Labs on Admission:   Recent Labs  11/06/13 2219  NA 139  K 3.5*  CL 101  CO2 22  GLUCOSE 86  BUN 7  CREATININE 0.67  CALCIUM 9.0    Recent Labs  11/06/13 2219  AST 10  ALT 6  ALKPHOS 85  BILITOT 1.0  PROT 5.8*  ALBUMIN 3.1*   No results found for this basename: LIPASE, AMYLASE,  in the last 72 hours  Recent Labs  11/06/13 2219  WBC 12.6*  NEUTROABS 7.7  HGB 11.7*  HCT 36.5  MCV 74.2*  PLT 204   No results found for this basename: CKTOTAL, CKMB, CKMBINDEX, TROPONINI,  in the last 72 hours No results found for this basename: TSH, T4TOTAL, FREET3, T3FREE, THYROIDAB,  in the last 72 hours No results found for this basename: VITAMINB12, FOLATE, FERRITIN, TIBC, IRON, RETICCTPCT,  in the last 72 hours Lab Results  Component Value Date   HGBA1C 6.0* 10/23/2010    Estimated Creatinine Clearance: 146.7 ml/min (by C-G formula based on Cr of 0.67). ABG No results found for this basename: phart, pco2, po2, hco3, tco2, acidbasedef, o2sat     No results found for this basename: DDIMER     Other results:  UA evidence of UTI   Cultures: No results found for this basename: sdes, specrequest, cult, reptstatus       Radiological Exams on  Admission: Dg Chest 2 View  11/06/2013   CLINICAL DATA:  History of lymphoma.  Headache  EXAM: CHEST  2 VIEW  COMPARISON:  None.  FINDINGS: There is a port in the right chest wall. Normal cardiac silhouette. There are bilateral perihilar pulmonary masses. Bilateral small pleural effusions. No pneumothorax.  IMPRESSION: Bilateral perihilar pulmonary masses represent either multifocal pneumonia or lymphomatous involvement of the pulmonary parenchyma.  Small effusions.   Electronically Signed   By: Suzy Bouchard M.D.   On: 11/06/2013 18:35   Ct Head Wo Contrast  11/06/2013   CLINICAL DATA:  Headache; nasal congestion and fever.  EXAM: CT HEAD WITHOUT CONTRAST  TECHNIQUE: Contiguous axial images were obtained from the base of the skull through the vertex without intravenous contrast.  COMPARISON:  None.  FINDINGS: There is no evidence of acute infarction, mass lesion, or intra- or extra-axial hemorrhage on CT.  The posterior fossa, including the cerebellum, brainstem and fourth ventricle, is within normal limits. The third and lateral ventricles, and basal ganglia are unremarkable in appearance. The cerebral hemispheres are symmetric in appearance, with normal gray-white differentiation. No mass effect or midline shift is seen.  There is no evidence of fracture; visualized osseous structures are unremarkable in appearance. The orbits are within normal limits. There is near complete opacification of the left mastoid air cells, and mucoperiosteal thickening is seen within the maxillary sinuses and left side of the sphenoid sinus. The remaining paranasal sinuses and right mastoid air cells are well-aerated. No significant soft tissue abnormalities are seen.  IMPRESSION: 1. No acute intracranial pathology seen on CT. 2. Near complete opacification of the left mastoid air cells, and mucoperiosteal thickening within the maxillary sinuses and at the left side of the sphenoid sinus.   Electronically Signed   By: Garald Balding M.D.   On: 11/06/2013 23:09   Ct Angio Chest Pe W/cm &/or Wo Cm  11/07/2013   CLINICAL DATA:  Headache, fatigue, congestion, facial swelling and productive cough.  EXAM: CT ANGIOGRAPHY CHEST WITH CONTRAST  TECHNIQUE: Multidetector CT imaging of the chest was performed using the standard protocol during bolus administration of intravenous contrast. Multiplanar CT image reconstructions including MIPs were obtained to evaluate the vascular anatomy.  CONTRAST:  112mL OMNIPAQUE IOHEXOL 350 MG/ML SOLN  COMPARISON:  Chest radiograph performed 11/06/2013  FINDINGS: There is no evidence of central pulmonary embolus. Evaluation for pulmonary embolus is significantly suboptimal due to filling of collateral vessels along the thoracic and lumbar spine.  Multiple large masslike opacities are seen in a relatively central distribution bilaterally, with adjacent airspace consolidation. Given the patient's history of lymphoma, this may reflect lymphomatous involvement of the pulmonary parenchyma, or an unusual appearance to multifocal pneumonia. Metastatic disease from another primary is considered less likely. Associated small to moderate bilateral pleural effusions are seen, with mild loculation. There is no evidence of pneumothorax.  A trace pericardial effusion is noted. Mild edema is noted within the pericardial fat. Visualized mediastinal nodes remain borderline normal in size. The great vessels are grossly unremarkable in appearance. No axillary lymphadenopathy is seen. The visualized portions of the thyroid gland are unremarkable in appearance. A right-sided chest port is seen. 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  The visualized portions of the liver and spleen are unremarkable. Prominent collateral vessels are seen along the anterior abdominal wall.  Note is made of  a large 7.1 x 3.2 x 5.3 cm centrally hypoattenuating mass within the right chest wall, infiltrating into the right pectoralis major muscle. Given the patient's history, this is thought most likely to reflect lymphoma.  No acute osseous abnormalities are seen.  Review of the MIP images confirms the above findings.  IMPRESSION: 1. No evidence of central pulmonary embolus. Evaluation for pulmonary embolus is suboptimal  due to apparent occlusion of the superior vena cava. 2. Multiple large masslike opacities in a relatively central distribution bilaterally, with adjacent airspace consolidation. Given the patient's history of lymphoma, this may reflect lymphomatous involvement of the pulmonary parenchyma, or an unusual appearance to multifocal pneumonia. Metastatic disease from another primary malignancy is considered less likely. 3. Associated small to moderate bilateral pleural effusions seen, with mild loculation. 4. Mild edema within the pericardial fat; trace pericardial effusion seen. 5. Unusual filling of collateral vessels along the thoracic and lumbar spine, and mild dilatation of the azygos vein, with prominent collateral vessels along the anterior abdominal wall. 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 6. Large 7.1 x 3.5 x 5.3 cm centrally hypoattenuating mass within the right chest wall, infiltrating into the right pectoralis major muscle. Given the patient's history, this is thought most likely to reflect lymphoma, though malignancy of other primary origin could have a similar appearance.   Electronically Signed   By: Garald Balding M.D.   On: 11/07/2013 01:03    Chart has been reviewed  Assessment/Plan  This is a 33 year old female with known history of non-Hodgkin's lymphoma followed by Gerlene Burdock to going chemotherapy with trial drug, here with fever most likely secondary to UTI  Present on Admission:  . Fever - given his to non-Hodgkin's  lymphoma will admit, most likely source is urinary tract infection other possibilities include influenza given some cough. CT scan could not exclude pulmonary infiltrate. CT scan of the head showed possible sinusitis. Will cover broadly with vancomycin and Zosyn. Obtain blood cultures. Influenza PCR. Initiate Tamiflu.  . Non Hodgkin's lymphoma - extensive tumors and lymphadenopathy. Unsure if this was stable from baseline. We'll need records from [facility], oncology is aware will see patient any  . UTI (urinary tract infection) - covered with Zosyn await results of urine culture  . Hypokalemia - replace  . 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 HCAP (healthcare-associated pneumonia) - CT scan could not rule out possible infiltrate given cough and fever will cover broadly with vancomycin and Zosyn  . Sinusitis - patient has history of congestion will cover for possible sinusitis. She has extensive lymphadenopathy she is firm consistent with history of non-Hodgkin's lymphoma   Prophylaxis:   Lovenox, Protonix  CODE STATUS: FULL CODE  Other plan as per orders.  I have spent a total of 55 min on this admission  Joline Encalada 11/07/2013, 1:41 AM

## 2013-11-07 NOTE — ED Notes (Signed)
Pt transported to CT ?

## 2013-11-08 LAB — URINE CULTURE: Colony Count: >=100000

## 2013-11-08 LAB — LEGIONELLA ANTIGEN, URINE: Legionella Antigen, Urine: NEGATIVE

## 2013-11-08 MED ORDER — ENOXAPARIN SODIUM 60 MG/0.6ML ~~LOC~~ SOLN
60.0000 mg | SUBCUTANEOUS | Status: DC
  Filled 2013-11-08 (×2): qty 0.6

## 2013-11-08 MED ORDER — ACETAMINOPHEN 325 MG PO TABS
650.0000 mg | ORAL_TABLET | Freq: Four times a day (QID) | ORAL | Status: DC | PRN
  Administered 2013-11-08 – 2013-11-10 (×3): 650 mg via ORAL
  Filled 2013-11-08 (×3): qty 2

## 2013-11-08 NOTE — Progress Notes (Signed)
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 -Suspect mainly secondary to her UTI. Doubt she has PNA. -Discussed via phone with Dr. Earlie Server. -He wound recommend continuing IV antibiotics for at least 5 days, despite cx data, and treating for at least 7 days total.  HA/Dizziness -Likely a side effect from her oral chemo drug, ibrutinib.  NHL -Continue follow up at [facility] once discharged from here.  E Coli UTI -Cx data pending. -Would treat with IV antibiotics for 5 days given Dr. Worthy Flank recommendations.  Code Status: Full Code Family Communication: Patient only  Disposition Plan: Home when ready   Consultants:  None   Antibiotics:  Vanc  Zosyn   Subjective: Still complains of HA. Would like to avoid narcotic use if possible.  Objective: Filed Vitals:   11/07/13 2101 11/08/13 0450 11/08/13 0810 11/08/13 1344  BP: 111/69 116/71 99/87 101/59  Pulse: 91 83 80 76  Temp: 97.8 F (36.6 C) 97.6 F (36.4 C) 97.4 F (36.3 C) 97.5 F (36.4 C)  TempSrc: Oral Oral Oral Oral  Resp: 20 20 20 20   Height: 6\' 2"  (1.88 m)     Weight: 121.972 kg (268 lb 14.4 oz)     SpO2: 96% 96% 97% 97%    Intake/Output Summary (Last 24 hours) at 11/08/13 1602 Last data filed at 11/08/13 1346  Gross per 24 hour  Intake    720 ml  Output      0 ml  Net    720 ml   Filed Weights   11/06/13 1730 11/07/13 0423 11/07/13 2101  Weight: 117.935 kg (260 lb) 121.972 kg (268 lb 14.4 oz) 121.972 kg (268 lb 14.4 oz)    Exam:   General:  AA ox3  Cardiovascular: RRR  Respiratory: CTA B  Abdomen: S/NT/ND/+BS  Extremities: no C/C/E   Neurologic:  Intact and non-focal.  Data Reviewed: Basic Metabolic Panel:  Recent Labs Lab 11/06/13 2219  NA 139  K 3.5*  CL 101  CO2 22  GLUCOSE 86  BUN 7  CREATININE 0.67   CALCIUM 9.0   Liver Function Tests:  Recent Labs Lab 11/06/13 2219  AST 10  ALT 6  ALKPHOS 85  BILITOT 1.0  PROT 5.8*  ALBUMIN 3.1*   No results found for this basename: LIPASE, AMYLASE,  in the last 168 hours No results found for this basename: AMMONIA,  in the last 168 hours CBC:  Recent Labs Lab 11/06/13 2219  WBC 12.6*  NEUTROABS 7.7  HGB 11.7*  HCT 36.5  MCV 74.2*  PLT 204   Cardiac Enzymes: No results found for this basename: CKTOTAL, CKMB, CKMBINDEX, TROPONINI,  in the last 168 hours BNP (last 3 results) No results found for this basename: PROBNP,  in the last 8760 hours CBG: No results found for this basename: GLUCAP,  in the last 168 hours  Recent Results (from the past 240 hour(s))  CULTURE, BLOOD (ROUTINE X 2)     Status: None   Collection Time    11/06/13 10:19 PM      Result Value Range Status   Specimen Description BLOOD LEFT ANTECUBITAL   Final   Special Requests BOTTLES DRAWN AEROBIC AND ANAEROBIC 5CC EA   Final   Culture  Setup Time     Final   Value: 11/07/2013 08:52     Performed at [facility]  Culture     Final   Value:        BLOOD CULTURE RECEIVED NO GROWTH TO DATE CULTURE WILL BE HELD FOR 5 DAYS BEFORE ISSUING A FINAL NEGATIVE REPORT     Performed at [facility]   Report Status PENDING   Incomplete  URINE CULTURE     Status: None   Collection Time    11/06/13 10:23 PM      Result Value Range Status   Specimen Description URINE, CLEAN CATCH   Final   Special Requests NONE   Final   Culture  Setup Time     Final   Value: 11/06/2013 23:55     Performed at [facility]     Final   Value: >=100,000 COLONIES/ML     Performed at [facility]   Culture     Final   Value: ESCHERICHIA COLI     Performed at [facility]   Report Status 11/08/2013 FINAL   Final   Organism ID, Bacteria ESCHERICHIA COLI   Final  CULTURE, BLOOD (ROUTINE X 2)     Status: None   Collection Time     11/06/13 10:56 PM      Result Value Range Status   Specimen Description BLOOD RIGHT ARM   Final   Special Requests BOTTLES DRAWN AEROBIC AND ANAEROBIC 5CC EACH   Final   Culture  Setup Time     Final   Value: 11/07/2013 08:51     Performed at [facility]   Culture     Final   Value:        BLOOD CULTURE RECEIVED NO GROWTH TO DATE CULTURE WILL BE HELD FOR 5 DAYS BEFORE ISSUING A FINAL NEGATIVE REPORT     Performed at [facility]   Report Status PENDING   Incomplete  CULTURE, EXPECTORATED SPUTUM-ASSESSMENT     Status: None   Collection Time    11/07/13 10:44 AM      Result Value Range Status   Specimen Description SPUTUM   Final   Special Requests NONE   Final   Sputum evaluation     Final   Value: THIS SPECIMEN IS ACCEPTABLE. RESPIRATORY CULTURE REPORT TO FOLLOW.   Report Status 11/07/2013 FINAL   Final  CULTURE, RESPIRATORY (NON-EXPECTORATED)     Status: None   Collection Time    11/07/13 10:44 AM      Result Value Range Status   Specimen Description SPUTUM   Final   Special Requests NONE   Final   Gram Stain     Final   Value: ABUNDANT WBC PRESENT, PREDOMINANTLY PMN     RARE SQUAMOUS EPITHELIAL CELLS PRESENT     RARE GRAM POSITIVE COCCI     IN PAIRS     Performed at [facility]   Culture     Final   Value: Culture reincubated for better growth     Performed at [facility]   Report Status PENDING   Incomplete     Studies: Dg Chest 2 View  11/06/2013   CLINICAL DATA:  History of lymphoma.  Headache  EXAM: CHEST  2 VIEW  COMPARISON:  None.  FINDINGS: There is a port in the right chest wall. Normal cardiac silhouette. There are bilateral perihilar pulmonary masses. Bilateral small pleural effusions. No pneumothorax.  IMPRESSION: Bilateral perihilar pulmonary masses represent either multifocal pneumonia or lymphomatous involvement of the pulmonary parenchyma.  Small effusions.   Electronically Signed   By: Suzy Bouchard M.D.   On: 11/06/2013  18:35   Ct Head Wo Contrast  11/06/2013   CLINICAL DATA:  Headache; nasal congestion and fever.  EXAM: CT HEAD WITHOUT CONTRAST  TECHNIQUE: Contiguous axial images were obtained from the base of the skull through the vertex without intravenous contrast.  COMPARISON:  None.  FINDINGS: There is no evidence of acute infarction, mass lesion, or intra- or extra-axial hemorrhage on CT.  The posterior fossa, including the cerebellum, brainstem and fourth ventricle, is within normal limits. The third and lateral ventricles, and basal ganglia are unremarkable in appearance. The cerebral hemispheres are symmetric in appearance, with normal gray-white differentiation. No mass effect or midline shift is seen.  There is no evidence of fracture; visualized osseous structures are unremarkable in appearance. The orbits are within normal limits. There is near complete opacification of the left mastoid air cells, and mucoperiosteal thickening is seen within the maxillary sinuses and left side of the sphenoid sinus. The remaining paranasal sinuses and right mastoid air cells are well-aerated. No significant soft tissue abnormalities are seen.  IMPRESSION: 1. No acute intracranial pathology seen on CT. 2. Near complete opacification of the left mastoid air cells, and mucoperiosteal thickening within the maxillary sinuses and at the left side of the sphenoid sinus.   Electronically Signed   By: Garald Balding M.D.   On: 11/06/2013 23:09   Ct Angio Chest Pe W/cm &/or Wo Cm  11/07/2013   CLINICAL DATA:  Headache, fatigue, congestion, facial swelling and productive cough.  EXAM: CT ANGIOGRAPHY CHEST WITH CONTRAST  TECHNIQUE: Multidetector CT imaging of the chest was performed using the standard protocol during bolus administration of intravenous contrast. Multiplanar CT image reconstructions including MIPs were obtained to evaluate the vascular anatomy.  CONTRAST:  172mL OMNIPAQUE IOHEXOL 350 MG/ML SOLN  COMPARISON:  Chest radiograph  performed 11/06/2013  FINDINGS: There is no evidence of central pulmonary embolus. Evaluation for pulmonary embolus is significantly suboptimal due to filling of collateral vessels along the thoracic and lumbar spine.  Multiple large masslike opacities are seen in a relatively central distribution bilaterally, with adjacent airspace consolidation. Given the patient's history of lymphoma, this may reflect lymphomatous involvement of the pulmonary parenchyma, or an unusual appearance to multifocal pneumonia. Metastatic disease from another primary is considered less likely. Associated small to moderate bilateral pleural effusions are seen, with mild loculation. There is no evidence of pneumothorax.  A trace pericardial effusion is noted. Mild edema is noted within the pericardial fat. Visualized mediastinal nodes remain borderline normal in size. The great vessels are grossly unremarkable in appearance. No axillary lymphadenopathy is seen. The visualized portions of the thyroid gland are unremarkable in appearance. A right-sided chest port is seen. 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  The visualized portions of the liver and spleen are unremarkable. Prominent collateral vessels are seen along the anterior abdominal wall.  Note is made of a large 7.1 x 3.2 x 5.3 cm centrally hypoattenuating mass within the right chest wall, infiltrating into the right pectoralis major muscle. Given the patient's history, this is thought most likely to reflect lymphoma.  No acute osseous abnormalities are seen.  Review of the MIP images confirms the above findings.  IMPRESSION: 1. No evidence of central pulmonary embolus. Evaluation for pulmonary embolus is suboptimal  due to apparent occlusion of the superior vena cava. 2. Multiple large masslike opacities in a relatively central  distribution bilaterally, with adjacent airspace consolidation. Given the patient's history of lymphoma, this may reflect lymphomatous involvement of the pulmonary parenchyma, or an unusual appearance to multifocal pneumonia. Metastatic disease from another primary malignancy is considered less likely. 3. Associated small to moderate bilateral pleural effusions seen, with mild loculation. 4. Mild edema within the pericardial fat; trace pericardial effusion seen. 5. Unusual filling of collateral vessels along the thoracic and lumbar spine, and mild dilatation of the azygos vein, with prominent collateral vessels along the anterior abdominal wall. 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 6. Large 7.1 x 3.5 x 5.3 cm centrally hypoattenuating mass within the right chest wall, infiltrating into the right pectoralis major muscle. Given the patient's history, this is thought most likely to reflect lymphoma, though malignancy of other primary origin could have a similar appearance.   Electronically Signed   By: Garald Balding M.D.   On: 11/07/2013 01:03    Scheduled Meds: . [START ON 11/09/2013] enoxaparin (LOVENOX) injection  60 mg Subcutaneous Q24H  . fluticasone  2 spray Each Nare Daily  . ibrutinib  560 mg Oral QHS  . OxyCODONE  15 mg Oral Q12H  . piperacillin-tazobactam (ZOSYN)  IV  3.375 g Intravenous Q8H  . vancomycin  1,250 mg Intravenous Q12H   Continuous Infusions:   Active Problems:   Non Hodgkin's lymphoma   UTI (urinary tract infection)   Hypokalemia   Fever   SVC (superior vena cava obstruction)   HCAP (healthcare-associated pneumonia)   Sinusitis    Time spent: 35 minutes. Greater than 50% of this time was spent in direct contact with the patient coordinating care.    Lelon Frohlich  [facility] Pager [phone]  If 7PM-7AM, please contact night-coverage at [email or web address], password [facility] 11/08/2013, 4:02 PM  LOS:  2 days

## 2013-11-09 DIAGNOSIS — J189 Pneumonia, unspecified organism: Secondary | ICD-10-CM

## 2013-11-09 LAB — BASIC METABOLIC PANEL
BUN: 4 mg/dL — ABNORMAL LOW (ref 6–23)
CO2: 24 meq/L (ref 19–32)
Calcium: 8.6 mg/dL (ref 8.4–10.5)
Chloride: 102 mEq/L (ref 96–112)
Creatinine, Ser: 0.58 mg/dL (ref 0.50–1.10)
GFR calc Af Amer: >90 mL/min (ref >90)
GFR calc non Af Amer: >90 mL/min (ref >90)
GLUCOSE: 85 mg/dL (ref 70–99)
POTASSIUM: 3.7 meq/L (ref 3.7–5.3)
Sodium: 139 mEq/L (ref 137–147)

## 2013-11-09 LAB — VANCOMYCIN, TROUGH: Vancomycin Tr: 10.4 ug/mL (ref 10.0–20.0)

## 2013-11-09 MED ORDER — VANCOMYCIN HCL 10 G IV SOLR
1500.0000 mg | Freq: Two times a day (BID) | INTRAVENOUS | Status: DC
  Filled 2013-11-09: qty 1500

## 2013-11-09 MED ORDER — VANCOMYCIN HCL IN DEXTROSE 1-5 GM/200ML-% IV SOLN
1000.0000 mg | Freq: Three times a day (TID) | INTRAVENOUS | Status: DC
  Administered 2013-11-09 – 2013-11-10 (×2): 1000 mg via INTRAVENOUS
  Filled 2013-11-09 (×5): qty 200

## 2013-11-09 MED ORDER — VANCOMYCIN HCL IN DEXTROSE 1-5 GM/200ML-% IV SOLN
1000.0000 mg | Freq: Three times a day (TID) | INTRAVENOUS | Status: DC
  Filled 2013-11-09 (×2): qty 200

## 2013-11-09 NOTE — Progress Notes (Signed)
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 -Suspect mainly secondary to her UTI & possible PNA (CXR, CT chest and sputum Cx) -Dr. Jerilee Hoh discussed via phone with Dr. Earlie Server. -He recommended continuing IV antibiotics for at least 5 days, despite cx data, and treating for at least 7 days total. - Influenza panel PCR negative. Urine Legionella and streptococcal antigen negative. Sputum culture shows moderate Staphylococcus aureus-final sensitivity pending. Blood cultures x2: Negative - Continue current IV vancomycin and Zosyn and consider transitioning to oral antibiotics depending on final sputum culture results in a.m.  HA/Dizziness -Likely a side effect from her oral chemo drug, ibrutinib.  NHL -Continue follow up at [facility] once discharged from here.  E Coli UTI -Pansensitive -Would treat with IV antibiotics for 5 days given Dr. Worthy Flank recommendations.  Anemia - Likely secondary to NHL. Follow CBC in a.m.  Code Status: Full Code Family Communication: Patient only  Disposition Plan: Home possibly 1/21-patient states that she cannot stay beyond tomorrow because she has kids to take care at home.  Consultants:  None  Antibiotics:  Vanc  Zosyn   Subjective: Denies complaints. States that she would like to leave tomorrow. Denies cough, dyspnea or chest pain.  Objective: Filed Vitals:   11/08/13 2109 11/09/13 0455 11/09/13 0800 11/09/13 1355  BP: 119/72 109/68 122/86 135/76  Pulse: 84 97 87 92  Temp: 98 F (36.7 C) 97.8 F (36.6 C) 98.1 F (36.7 C) 98.7 F (37.1 C)  TempSrc: Oral Oral Oral Oral  Resp: 18 18 18 18   Height: 6\' 2"  (1.88 m)     Weight: 121.972 kg (268 lb 14.4 oz)     SpO2: 97% 96% 96% 96%    Intake/Output Summary (Last 24 hours) at 11/09/13 1725 Last data filed at 11/09/13  1300  Gross per 24 hour  Intake   1400 ml  Output      0 ml  Net   1400 ml   Filed Weights   11/07/13 0423 11/07/13 2101 11/08/13 2109  Weight: 121.972 kg (268 lb 14.4 oz) 121.972 kg (268 lb 14.4 oz) 121.972 kg (268 lb 14.4 oz)    Exam:   General:  AA ox3  Cardiovascular: RRR  Respiratory: Reduced breath sounds in the bases, occasional bilateral rhonchi. No increased work of breathing.  Abdomen: S/NT/ND/+BS  Extremities: no C/C/E   Neurologic:  Intact and non-focal.  Data Reviewed: Basic Metabolic Panel:  Recent Labs Lab 11/06/13 2219  NA 139  K 3.5*  CL 101  CO2 22  GLUCOSE 86  BUN 7  CREATININE 0.67  CALCIUM 9.0   Liver Function Tests:  Recent Labs Lab 11/06/13 2219  AST 10  ALT 6  ALKPHOS 85  BILITOT 1.0  PROT 5.8*  ALBUMIN 3.1*   No results found for this basename: LIPASE, AMYLASE,  in the last 168 hours No results found for this basename: AMMONIA,  in the last 168 hours CBC:  Recent Labs Lab 11/06/13 2219  WBC 12.6*  NEUTROABS 7.7  HGB 11.7*  HCT 36.5  MCV 74.2*  PLT 204   Cardiac Enzymes: No results found for this basename: CKTOTAL, CKMB, CKMBINDEX, TROPONINI,  in the last 168 hours BNP (last 3 results) No results found for this basename: PROBNP,  in the last 8760 hours CBG: No results found for this basename: GLUCAP,  in the last 168 hours  Recent Results (from the past 240 hour(s))  CULTURE, BLOOD (ROUTINE X 2)     Status: None   Collection Time    11/06/13 10:19 PM      Result Value Range Status   Specimen Description BLOOD LEFT ANTECUBITAL   Final   Special Requests BOTTLES DRAWN AEROBIC AND ANAEROBIC 5CC EA   Final   Culture  Setup Time     Final   Value: 11/07/2013 08:52     Performed at [facility]   Culture     Final   Value:        BLOOD CULTURE RECEIVED NO GROWTH TO DATE CULTURE WILL BE HELD FOR 5 DAYS BEFORE ISSUING A FINAL NEGATIVE REPORT     Performed at [facility]   Report Status PENDING    Incomplete  URINE CULTURE     Status: None   Collection Time    11/06/13 10:23 PM      Result Value Range Status   Specimen Description URINE, CLEAN CATCH   Final   Special Requests NONE   Final   Culture  Setup Time     Final   Value: 11/06/2013 23:55     Performed at [place]     Final   Value: >=100,000 COLONIES/ML     Performed at [facility]   Culture     Final   Value: ESCHERICHIA COLI     Performed at [facility]   Report Status 11/08/2013 FINAL   Final   Organism ID, Bacteria ESCHERICHIA COLI   Final  CULTURE, BLOOD (ROUTINE X 2)     Status: None   Collection Time    11/06/13 10:56 PM      Result Value Range Status   Specimen Description BLOOD RIGHT ARM   Final   Special Requests BOTTLES DRAWN AEROBIC AND ANAEROBIC 5CC EACH   Final   Culture  Setup Time     Final   Value: 11/07/2013 08:51     Performed at [facility]   Culture     Final   Value:        BLOOD CULTURE RECEIVED NO GROWTH TO DATE CULTURE WILL BE HELD FOR 5 DAYS BEFORE ISSUING A FINAL NEGATIVE REPORT     Performed at [facility]   Report Status PENDING   Incomplete  CULTURE, EXPECTORATED SPUTUM-ASSESSMENT     Status: None   Collection Time    11/07/13 10:44 AM      Result Value Range Status   Specimen Description SPUTUM   Final   Special Requests NONE   Final   Sputum evaluation     Final   Value: THIS SPECIMEN IS ACCEPTABLE. RESPIRATORY CULTURE REPORT TO FOLLOW.   Report Status 11/07/2013 FINAL   Final  CULTURE, RESPIRATORY (NON-EXPECTORATED)     Status: None   Collection Time    11/07/13 10:44 AM      Result Value Range Status   Specimen Description SPUTUM   Final   Special Requests NONE   Final   Gram Stain     Final   Value: ABUNDANT WBC PRESENT, PREDOMINANTLY PMN     RARE SQUAMOUS EPITHELIAL CELLS PRESENT     RARE GRAM POSITIVE COCCI     IN PAIRS     Performed at [facility]   Culture     Final   Value: Eileen Evans  Note: RIFAMPIN AND GENTAMICIN SHOULD NOT BE USED AS SINGLE DRUGS FOR TREATMENT OF STAPH INFECTIONS.     Performed at [facility]   Report Status PENDING   Incomplete     Studies: No results found.  Scheduled Meds: . enoxaparin (LOVENOX) injection  60 mg Subcutaneous Q24H  . fluticasone  2 spray Each Nare Daily  . ibrutinib  560 mg Oral QHS  . OxyCODONE  15 mg Oral Q12H  . piperacillin-tazobactam (ZOSYN)  IV  3.375 g Intravenous Q8H  . vancomycin  1,250 mg Intravenous Q12H   Continuous Infusions:   Active Problems:   Non Hodgkin's lymphoma   UTI (urinary tract infection)   Hypokalemia   Fever   SVC (superior vena cava obstruction)   HCAP (healthcare-associated pneumonia)   Sinusitis    Time spent: 35 minutes. Greater than 50% of this time was spent in direct contact with the patient coordinating care.    [facility]  [facility] Pager [phone]  If 7PM-7AM, please contact night-coverage at [email or web address], password [facility] 11/09/2013, 5:25 PM  LOS: 3 days

## 2013-11-09 NOTE — Progress Notes (Signed)
ANTIBIOTIC CONSULT NOTE - Follow Up  Pharmacy Consult for vancomycin Indication: pneumonia  No Known Allergies  Patient Measurements: Height: 6\' 2"  (188 cm) Weight: 268 lb 14.4 oz (121.972 kg) IBW/kg (Calculated) : 77.7  Vital Signs: Temp: 97.1 F (36.2 C) (01/21 1739) Temp src: Oral (01/21 1739) BP: 141/80 mmHg (01/21 1739) Pulse Rate: 89 (01/21 1739)  Labs:  Recent Labs  11/06/13 2219 11/09/13 1715  WBC 12.6*  --   HGB 11.7*  --   PLT 204  --   CREATININE 0.67 0.58   Estimated Creatinine Clearance: 149.2 ml/min (by C-G formula based on Cr of 0.58).   Microbiology: Recent Results (from the past 720 hour(s))  CULTURE, BLOOD (ROUTINE X 2)     Status: None   Collection Time    11/06/13 10:19 PM      Result Value Range Status   Specimen Description BLOOD LEFT ANTECUBITAL   Final   Special Requests BOTTLES DRAWN AEROBIC AND ANAEROBIC 5CC EA   Final   Culture  Setup Time     Final   Value: 11/07/2013 08:52     Performed at [facility]   Culture     Final   Value:        BLOOD CULTURE RECEIVED NO GROWTH TO DATE CULTURE WILL BE HELD FOR 5 DAYS BEFORE ISSUING A FINAL NEGATIVE REPORT     Performed at [facility]   Report Status PENDING   Incomplete  URINE CULTURE     Status: None   Collection Time    11/06/13 10:23 PM      Result Value Range Status   Specimen Description URINE, CLEAN CATCH   Final   Special Requests NONE   Final   Culture  Setup Time     Final   Value: 11/06/2013 23:55     Performed at [place]     Final   Value: >=100,000 COLONIES/ML     Performed at [facility]   Culture     Final   Value: ESCHERICHIA COLI     Performed at [facility]   Report Status 11/08/2013 FINAL   Final   Organism ID, Bacteria ESCHERICHIA COLI   Final  CULTURE, BLOOD (ROUTINE X 2)     Status: None   Collection Time    11/06/13 10:56 PM      Result Value Range Status   Specimen Description BLOOD RIGHT ARM    Final   Special Requests BOTTLES DRAWN AEROBIC AND ANAEROBIC 5CC EACH   Final   Culture  Setup Time     Final   Value: 11/07/2013 08:51     Performed at [facility]   Culture     Final   Value:        BLOOD CULTURE RECEIVED NO GROWTH TO DATE CULTURE WILL BE HELD FOR 5 DAYS BEFORE ISSUING A FINAL NEGATIVE REPORT     Performed at [facility]   Report Status PENDING   Incomplete  CULTURE, EXPECTORATED SPUTUM-ASSESSMENT     Status: None   Collection Time    11/07/13 10:44 AM      Result Value Range Status   Specimen Description SPUTUM   Final   Special Requests NONE   Final   Sputum evaluation     Final   Value: THIS SPECIMEN IS ACCEPTABLE. RESPIRATORY CULTURE REPORT TO FOLLOW.   Report Status 11/07/2013 FINAL   Final  CULTURE, RESPIRATORY (NON-EXPECTORATED)     Status: None   Collection Time    11/07/13 10:44 AM      Result Value Range Status   Specimen Description SPUTUM   Final   Special Requests NONE   Final   Gram Stain     Final   Value: ABUNDANT WBC PRESENT, PREDOMINANTLY PMN     RARE SQUAMOUS EPITHELIAL CELLS PRESENT     RARE GRAM POSITIVE COCCI     IN PAIRS     Performed at [facility]   Culture     Final   Value: MODERATE STAPHYLOCOCCUS AUREUS     Note: RIFAMPIN AND GENTAMICIN SHOULD NOT BE USED AS SINGLE DRUGS FOR TREATMENT OF STAPH INFECTIONS.     Performed at [facility]   Report Status PENDING   Incomplete    Medical History: Past Medical History  Diagnosis Date  . Cancer   . Lymphoma     Medications:  Prescriptions prior to admission  Medication Sig Dispense Refill  . ibrutinib (IMBRUVICA) 140 MG capsul Take 560 mg by mouth daily.      . OxyCODONE (OXYCONTIN) 15 mg T12A 12 hr tablet Take 15 mg by mouth every 12 (twelve) hours.      . Oxycodone HCl 10 MG TABS Take 10 mg by mouth 3 (three) times daily as needed (for breakthrough pain).       Scheduled:  . enoxaparin (LOVENOX) injection  60 mg Subcutaneous Q24H  .  fluticasone  2 spray Each Nare Daily  . ibrutinib  560 mg Oral QHS  . OxyCODONE  15 mg Oral Q12H  . piperacillin-tazobactam (ZOSYN)  IV  3.375 g Intravenous Q8H  . [START ON 11/10/2013] vancomycin  1,000 mg Intravenous Q8H    Assessment: 33yo female w/ non-Hodgkin's lymphoma presents w/ HA and fever, on oral chemotherapy followed by [facility], CT concerning for PNA, to begin IV ABX.  WBC 12.6, afebrile, respiritory Cx MRSA.  Currently treated with Vancomycin 1250mg  IV q12, VT 10.4 < goal 15-20.  Increase vancomycin dosing.  UCX ecoli S to zosyn current tx 3.375Gm q8.  Goal of Therapy:  Vancomycin trough level 15-20 mcg/ml  Plan:  Increase Vancomycin 1Gm q8   Bonnita Nasuti Pharm.D. CPP, BCPS Clinical Pharmacist [phone] 11/09/2013 6:59 PM

## 2013-11-10 LAB — CULTURE, RESPIRATORY W GRAM STAIN

## 2013-11-10 LAB — CULTURE, RESPIRATORY

## 2013-11-10 MED ORDER — CEPHALEXIN 500 MG PO CAPS: 500.0000 mg | ORAL_CAPSULE | Freq: Four times a day (QID) | ORAL | Status: AC

## 2013-11-10 NOTE — Progress Notes (Signed)
Patient SpO2 assessed at 08:32 and found to be 88-90% on room air.  Rn notified patient of need for oxygen saturation testing to assess for home oxygen needs.  Patient declined.  MD notified patient of need for testing and risks of  hypoxia.  Patient refused testing in presence of husband and RN.

## 2013-11-10 NOTE — Progress Notes (Signed)
Patient discharged.  Patient educated on discharge instructions, discharge medications, and follow-up appointments.  Patient educated on UTI, signs and symptoms, and when to call a doctor.  Patient and husband verbalized understanding.  Patient given Keflex prescription to pick up at any pharmacy.  Patient medications returned from inpatient pharmacy.  Patient belongings gathered.  IV removed.  Patient escorted off unit via wheelchair with volunteer services.

## 2013-11-10 NOTE — Discharge Summary (Signed)
Physician Discharge Summary  Eileen Evans [id number] DOB: 10-05-1981 DOA: 11/06/2013  PCP: No PCP Per Patient  Admit date: 11/06/2013 Discharge date: 11/10/2013  Time spent: Less than 30 minutes  Recommendations for Outpatient Follow-up:  1. Oncologist at [facility], in 3 days with repeat labs (CBC & BMP) 2. Followup final blood culture results that were sent from the hospital.  Discharge Diagnoses:  Active Problems:   Non Hodgkin's lymphoma   UTI (urinary tract infection)   Hypokalemia   Fever   SVC (superior vena cava obstruction)   HCAP (healthcare-associated pneumonia)   Sinusitis   Discharge Condition: Improved & Stable  Diet recommendation: Heart healthy diet  Filed Weights   11/07/13 2101 11/08/13 2109 11/09/13 2043  Weight: 121.972 kg (268 lb 14.4 oz) 121.972 kg (268 lb 14.4 oz) 121.972 kg (268 lb 14.4 oz)    History of present illness:  33 year old female with history of non-Hodgkin's lymphoma, followed by oncologist at [facility], was admitted on 11/07/13 with complaints of fever of 101.95F, dysuria, headache and dizziness. She was apparently diagnosed with NHL in May 2012 and had recurrence of her disease and was put on an experimental drug. In the ED, she had low-grade fever of 99.8F, WBC 12.6, CT head without acute pathology, CTA chest which did not show PE but showed findings consistent with her lymphoma and airspace consolidation (cannot rule pneumonia versus parenchymal involvement by lymphoma) and positive UA.  Hospital Course:   She was started empirically on IV vancomycin and Zosyn for UTI and HCAP. Dr. Jerilee Hoh discussed her care with oncologist Dr. Curt Bears who recommended continuing IV antibiotics for 5 days with total treatment for 7 days duration. Patient has completed 3 days of IV antibiotics but refuses to stay any further in the hospital. She also refuses evaluation for home O2 requirement. She claims that her oxygen  saturations in the past have ranged between 82 and 88% which her physicians at [facility] are aware and that she's not been placed on oxygen. She and spouse have been extensively counseled regarding dangerous consequences of hypoxia including decline & death. She verbalizes understanding but refuses to be tested and refuses oxygen repeatedly. She has clinically improved and denies dyspnea, cough or chest pain. Her sputum culture shows MSSA and urine culture shows pansensitive Escherichia coli. She will be transitioned to oral Keflex to complete a total 10 days of treatment. She has been in touch with her physicians at [facility] who apparently will arrange for followup early next week. Her headache and dizziness may be side effect from her oral chemotherapy or secondary to infections that she had on admission. Patient also states that she gets recurrent left-sided facial swelling which is not new for her. Her anemia is likely secondary to NHL. Hypokalemia was replaced. SVC obstruction seems chronic given evidence of collaterals and patient states that she is aware that she has obstruction of SVC. She has clinically improved and is medically stable for discharge home (except possible hypoxia-see discussion above) with close outpatient followup with her M.D. at [facility].  Consultations:  None   Procedures:  None    Discharge Exam:  Complaints:  Patient denies complaints and asks to be discharged.  Filed Vitals:   11/09/13 1739 11/09/13 2043 11/10/13 0549 11/10/13 0832  BP: 141/80 136/88 117/84 116/67  Pulse: 89 99 94 86  Temp: 97.1 F (36.2 C) 97.7 F (36.5 C) 97.3 F (36.3 C) 97.9 F (36.6 C)  TempSrc: Oral Oral Oral Oral  Resp: 18 18 18 20   Height:  6\' 2"  (1.88 m)    Weight:  121.972 kg (268 lb 14.4 oz)    SpO2: 98% 96% 96% 90%    General: AA ox3  Cardiovascular: RRR  Respiratory: Reduced breath sounds in the bases, occasional bilateral rhonchi. No increased work of  breathing.  Abdomen: S/NT/ND/+BS  Extremities: no C/C/E  Neurologic: Intact and non-focal.   Discharge Instructions      Discharge Orders   Future Orders Complete By Expires   Call MD for:  difficulty breathing, headache or visual disturbances  As directed    Call MD for:  extreme fatigue  As directed    Call MD for:  persistant dizziness or light-headedness  As directed    Call MD for:  persistant nausea and vomiting  As directed    Call MD for:  severe uncontrolled pain  As directed    Call MD for:  temperature >100.4  As directed    Diet - low sodium heart healthy  As directed    Increase activity slowly  As directed        Medication List         cephALEXin 500 MG capsule  Commonly known as:  KEFLEX  Take 1 capsule (500 mg total) by mouth 4 (four) times daily.     IMBRUVICA 140 MG capsul  Generic drug:  ibrutinib  Take 560 mg by mouth daily.     OXYCONTIN 15 mg T12a 12 hr tablet  Generic drug:  OxyCODONE  Take 15 mg by mouth every 12 (twelve) hours.     Oxycodone HCl 10 MG Tabs  Take 10 mg by mouth 3 (three) times daily as needed (for breakthrough pain).       Follow-up Information   Follow up with MD(Oncologist) at [facility]. Schedule an appointment as soon as possible for a visit in 3 days. (To be seen with repeat labs (CBC & BMP))        The results of significant diagnostics from this hospitalization (including imaging, microbiology, ancillary and laboratory) are listed below for reference.    Significant Diagnostic Studies: Dg Chest 2 View  11/06/2013   CLINICAL DATA:  History of lymphoma.  Headache  EXAM: CHEST  2 VIEW  COMPARISON:  None.  FINDINGS: There is a port in the right chest wall. Normal cardiac silhouette. There are bilateral perihilar pulmonary masses. Bilateral small pleural effusions. No pneumothorax.  IMPRESSION: Bilateral perihilar pulmonary masses represent either multifocal pneumonia or lymphomatous involvement of the pulmonary  parenchyma.  Small effusions.   Electronically Signed   By: Suzy Bouchard M.D.   On: 11/06/2013 18:35   Ct Head Wo Contrast  11/06/2013   CLINICAL DATA:  Headache; nasal congestion and fever.  EXAM: CT HEAD WITHOUT CONTRAST  TECHNIQUE: Contiguous axial images were obtained from the base of the skull through the vertex without intravenous contrast.  COMPARISON:  None.  FINDINGS: There is no evidence of acute infarction, mass lesion, or intra- or extra-axial hemorrhage on CT.  The posterior fossa, including the cerebellum, brainstem and fourth ventricle, is within normal limits. The third and lateral ventricles, and basal ganglia are unremarkable in appearance. The cerebral hemispheres are symmetric in appearance, with normal gray-white differentiation. No mass effect or midline shift is seen.  There is no evidence of fracture; visualized osseous structures are unremarkable in appearance. The orbits are within normal limits. There is near complete  opacification of the left mastoid air cells, and mucoperiosteal thickening is seen within the maxillary sinuses and left side of the sphenoid sinus. The remaining paranasal sinuses and right mastoid air cells are well-aerated. No significant soft tissue abnormalities are seen.  IMPRESSION: 1. No acute intracranial pathology seen on CT. 2. Near complete opacification of the left mastoid air cells, and mucoperiosteal thickening within the maxillary sinuses and at the left side of the sphenoid sinus.   Electronically Signed   By: Garald Balding M.D.   On: 11/06/2013 23:09   Ct Angio Chest Pe W/cm &/or Wo Cm  11/07/2013   CLINICAL DATA:  Headache, fatigue, congestion, facial swelling and productive cough.  EXAM: CT ANGIOGRAPHY CHEST WITH CONTRAST  TECHNIQUE: Multidetector CT imaging of the chest was performed using the standard protocol during bolus administration of intravenous contrast. Multiplanar CT image reconstructions including MIPs were obtained to evaluate the  vascular anatomy.  CONTRAST:  1104mL OMNIPAQUE IOHEXOL 350 MG/ML SOLN  COMPARISON:  Chest radiograph performed 11/06/2013  FINDINGS: There is no evidence of central pulmonary embolus. Evaluation for pulmonary embolus is significantly suboptimal due to filling of collateral vessels along the thoracic and lumbar spine.  Multiple large masslike opacities are seen in a relatively central distribution bilaterally, with adjacent airspace consolidation. Given the patient's history of lymphoma, this may reflect lymphomatous involvement of the pulmonary parenchyma, or an unusual appearance to multifocal pneumonia. Metastatic disease from another primary is considered less likely. Associated small to moderate bilateral pleural effusions are seen, with mild loculation. There is no evidence of pneumothorax.  A trace pericardial effusion is noted. Mild edema is noted within the pericardial fat. Visualized mediastinal nodes remain borderline normal in size. The great vessels are grossly unremarkable in appearance. No axillary lymphadenopathy is seen. The visualized portions of the thyroid gland are unremarkable in appearance. A right-sided chest port is seen. 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  The visualized portions of the liver and spleen are unremarkable. Prominent collateral vessels are seen along the anterior abdominal wall.  Note is made of a large 7.1 x 3.2 x 5.3 cm centrally hypoattenuating mass within the right chest wall, infiltrating into the right pectoralis major muscle. Given the patient's history, this is thought most likely to reflect lymphoma.  No acute osseous abnormalities are seen.  Review of the MIP images confirms the above findings.  IMPRESSION: 1. No evidence of central pulmonary embolus. Evaluation for pulmonary embolus is suboptimal due to apparent  occlusion of the superior vena cava. 2. Multiple large masslike opacities in a relatively central distribution bilaterally, with adjacent airspace consolidation. Given the patient's history of lymphoma, this may reflect lymphomatous involvement of the pulmonary parenchyma, or an unusual appearance to multifocal pneumonia. Metastatic disease from another primary malignancy is considered less likely. 3. Associated small to moderate bilateral pleural effusions seen, with mild loculation. 4. Mild edema within the pericardial fat; trace pericardial effusion seen. 5. Unusual filling of collateral vessels along the thoracic and lumbar spine, and mild dilatation of the azygos vein, with prominent collateral vessels along the anterior abdominal wall. 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 6. Large 7.1 x 3.5 x 5.3 cm centrally hypoattenuating mass within the right chest wall, infiltrating into the right pectoralis major muscle. Given the patient's history, this is  thought most likely to reflect lymphoma, though malignancy of other primary origin could have a similar appearance.   Electronically Signed   By: Garald Balding M.D.   On: 11/07/2013 01:03    Microbiology: Recent Results (from the past 240 hour(s))  CULTURE, BLOOD (ROUTINE X 2)     Status: None   Collection Time    11/06/13 10:19 PM      Result Value Range Status   Specimen Description BLOOD LEFT ANTECUBITAL   Final   Special Requests BOTTLES DRAWN AEROBIC AND ANAEROBIC 5CC EA   Final   Culture  Setup Time     Final   Value: 11/07/2013 08:52     Performed at [facility]   Culture     Final   Value:        BLOOD CULTURE RECEIVED NO GROWTH TO DATE CULTURE WILL BE HELD FOR 5 DAYS BEFORE ISSUING A FINAL NEGATIVE REPORT     Performed at [facility]   Report Status PENDING   Incomplete  URINE CULTURE     Status: None   Collection Time    11/06/13 10:23 PM      Result Value  Range Status   Specimen Description URINE, CLEAN CATCH   Final   Special Requests NONE   Final   Culture  Setup Time     Final   Value: 11/06/2013 23:55     Performed at [facility]     Final   Value: >=100,000 COLONIES/ML     Performed at [facility]   Culture     Final   Value: ESCHERICHIA COLI     Performed at [facility]   Report Status 11/08/2013 FINAL   Final   Organism ID, Bacteria ESCHERICHIA COLI   Final  CULTURE, BLOOD (ROUTINE X 2)     Status: None   Collection Time    11/06/13 10:56 PM      Result Value Range Status   Specimen Description BLOOD RIGHT ARM   Final   Special Requests BOTTLES DRAWN AEROBIC AND ANAEROBIC 5CC EACH   Final   Culture  Setup Time     Final   Value: 11/07/2013 08:51     Performed at [facility]   Culture     Final   Value:        BLOOD CULTURE RECEIVED NO GROWTH TO DATE CULTURE WILL BE HELD FOR 5 DAYS BEFORE ISSUING A FINAL NEGATIVE REPORT     Performed at [facility]   Report Status PENDING   Incomplete  CULTURE, EXPECTORATED SPUTUM-ASSESSMENT     Status: None   Collection Time    11/07/13 10:44 AM      Result Value Range Status   Specimen Description SPUTUM   Final   Special Requests NONE   Final   Sputum evaluation     Final   Value: THIS SPECIMEN IS ACCEPTABLE. RESPIRATORY CULTURE REPORT TO FOLLOW.   Report Status 11/07/2013 FINAL   Final  CULTURE, RESPIRATORY (NON-EXPECTORATED)     Status: None   Collection Time    11/07/13 10:44 AM      Result Value Range Status   Specimen Description SPUTUM   Final   Special Requests NONE   Final   Gram Stain     Final   Value: ABUNDANT WBC PRESENT, PREDOMINANTLY PMN     RARE SQUAMOUS EPITHELIAL CELLS PRESENT  RARE GRAM POSITIVE COCCI     IN PAIRS     Performed at [facility]   Culture     Final   Value: MODERATE STAPHYLOCOCCUS AUREUS     Note: RIFAMPIN AND GENTAMICIN SHOULD NOT BE USED AS SINGLE DRUGS FOR TREATMENT OF  STAPH INFECTIONS.     Performed at [facility]   Report Status 11/10/2013 FINAL   Final   Organism ID, Bacteria STAPHYLOCOCCUS AUREUS   Final     Labs: Basic Metabolic Panel:  Recent Labs Lab 11/06/13 2219 11/09/13 1715  NA 139 139  K 3.5* 3.7  CL 101 102  CO2 22 24  GLUCOSE 86 85  BUN 7 4*  CREATININE 0.67 0.58  CALCIUM 9.0 8.6   Liver Function Tests:  Recent Labs Lab 11/06/13 2219  AST 10  ALT 6  ALKPHOS 85  BILITOT 1.0  PROT 5.8*  ALBUMIN 3.1*   No results found for this basename: LIPASE, AMYLASE,  in the last 168 hours No results found for this basename: AMMONIA,  in the last 168 hours CBC:  Recent Labs Lab 11/06/13 2219  WBC 12.6*  NEUTROABS 7.7  HGB 11.7*  HCT 36.5  MCV 74.2*  PLT 204   Cardiac Enzymes: No results found for this basename: CKTOTAL, CKMB, CKMBINDEX, TROPONINI,  in the last 168 hours BNP: BNP (last 3 results) No results found for this basename: PROBNP,  in the last 8760 hours CBG: No results found for this basename: GLUCAP,  in the last 168 hours  Additional labs: 1. Urine pregnancy test: Negative 2. Influenza panel PCR: Negative 3. Urine Legionella and streptococcal antigen: Negative   Signed:  Vernell Leep, MD, FACP, FHM. [facility] Pager [phone]  If 7PM-7AM, please contact night-coverage [email or web address] Password [facility] 11/10/2013, 1:07 PM

## 2013-11-13 LAB — CULTURE, BLOOD (ROUTINE X 2)
CULTURE: NO GROWTH
Culture: NO GROWTH

## 2014-02-17 DEATH — deceased

## 2014-05-31 IMAGING — US US RENAL KIDNEY
1 series · 14 of 25 positions shown · non-contrast
Comparison: none

REASON FOR EXAM: acute renal fialure
COMMENTS:

PROCEDURE:     US  - US KIDNEY  - May 21, 2013  [DATE]
RESULT:

[Series 1: us renal kidney · 0.28mm/px · 14 of 32 slices shown]
[im 1/32]
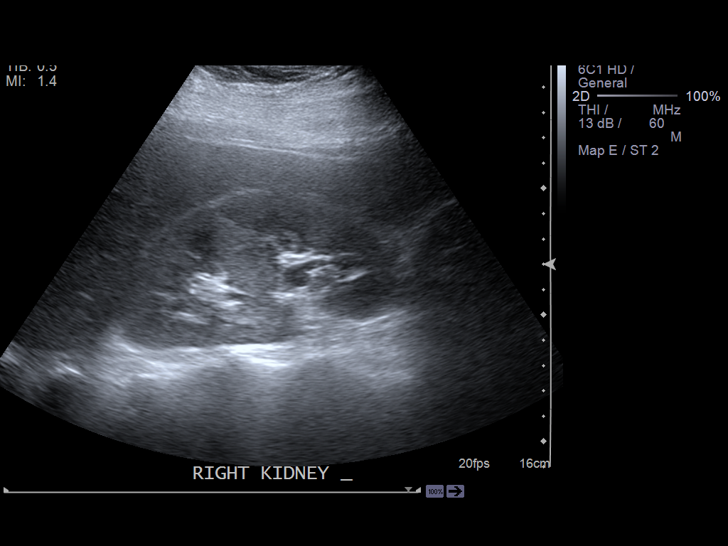
[im 3/32]
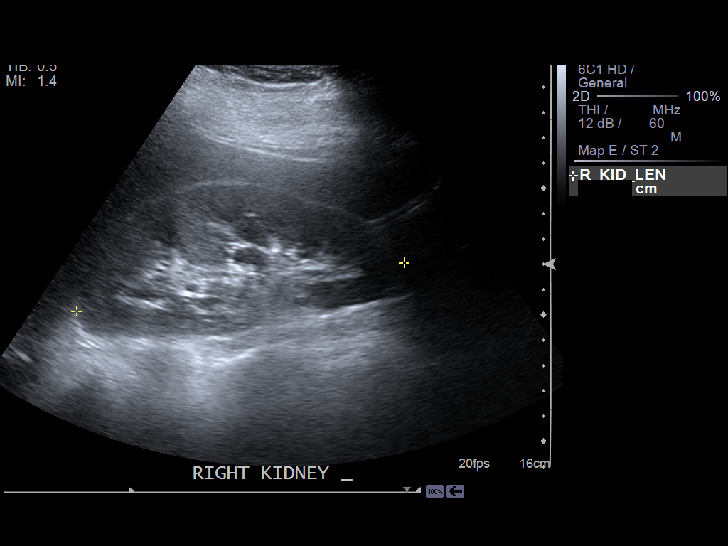
[im 6/32]
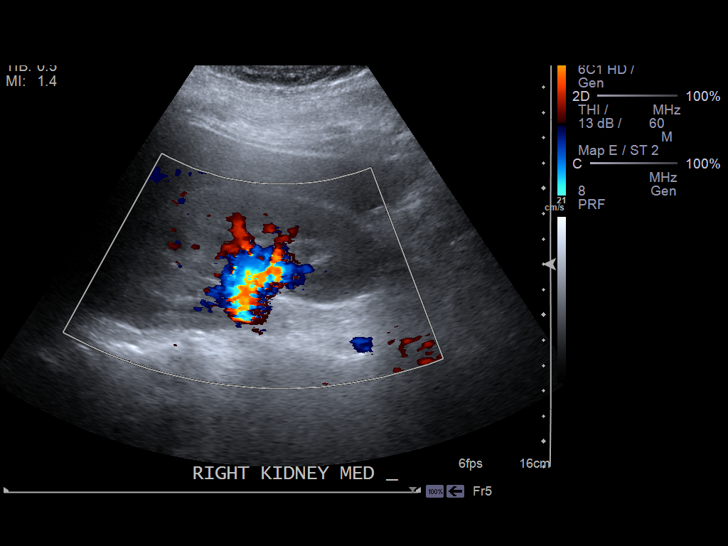
[im 8/32]
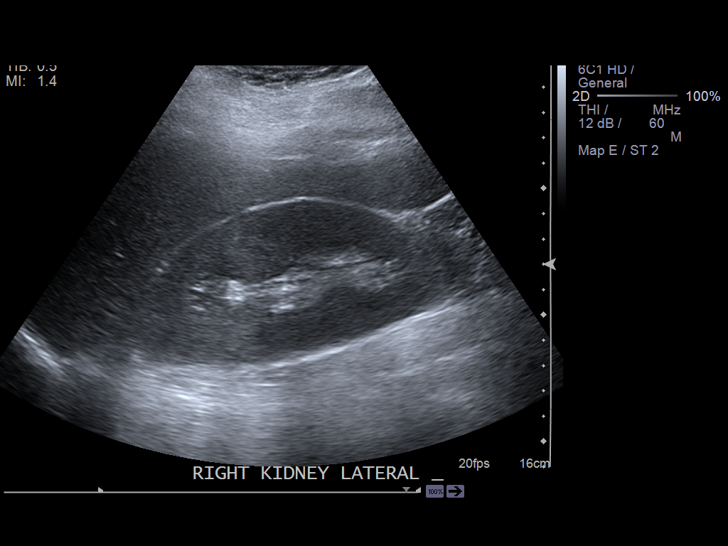
[im 11/32]
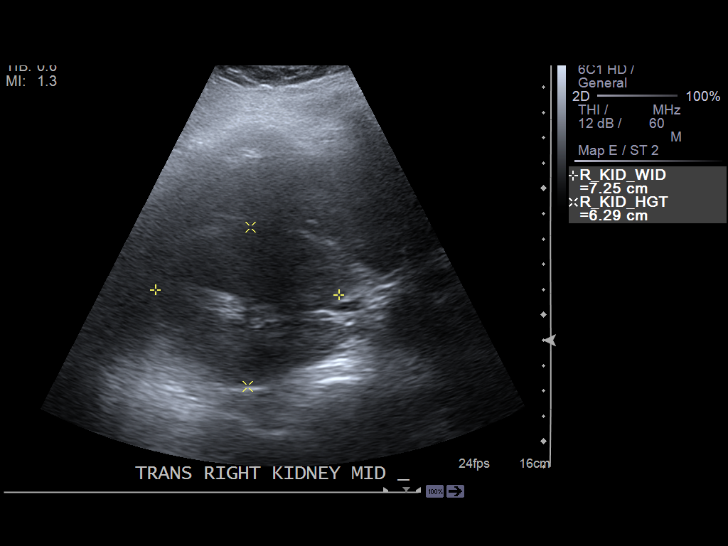
[im 12/32]
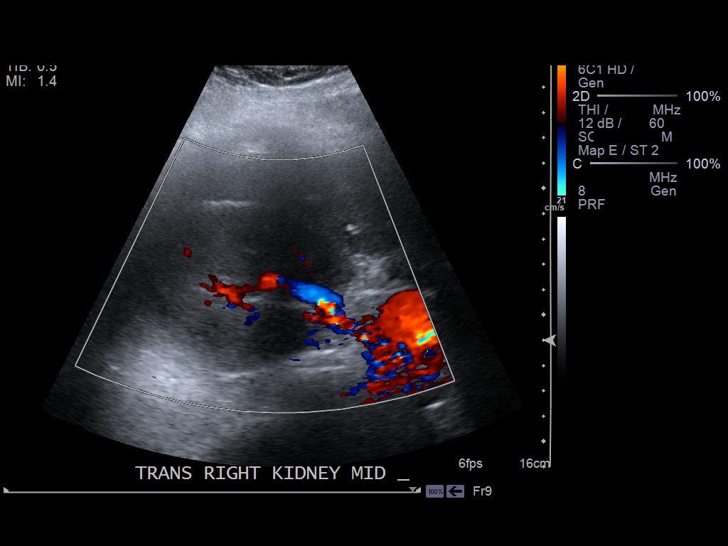
[im 15/32]
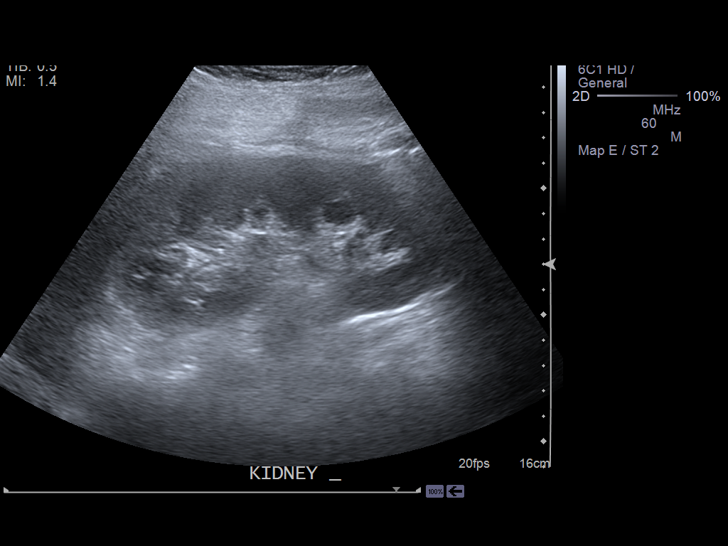
[im 17/32]
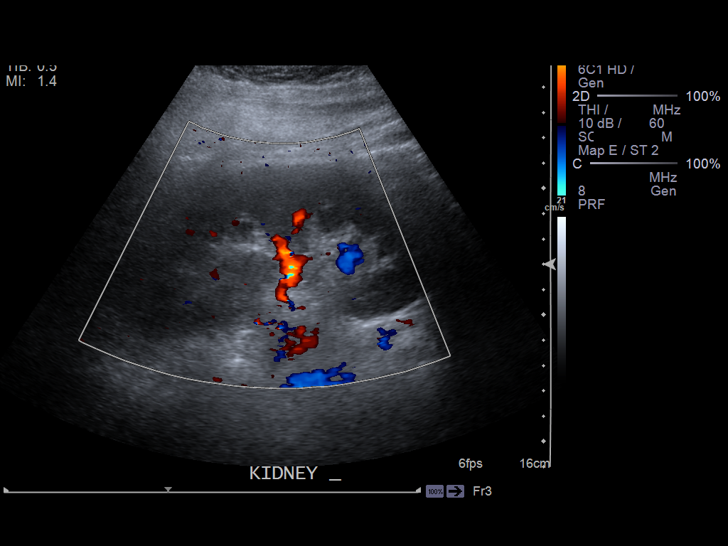
[im 20/32]
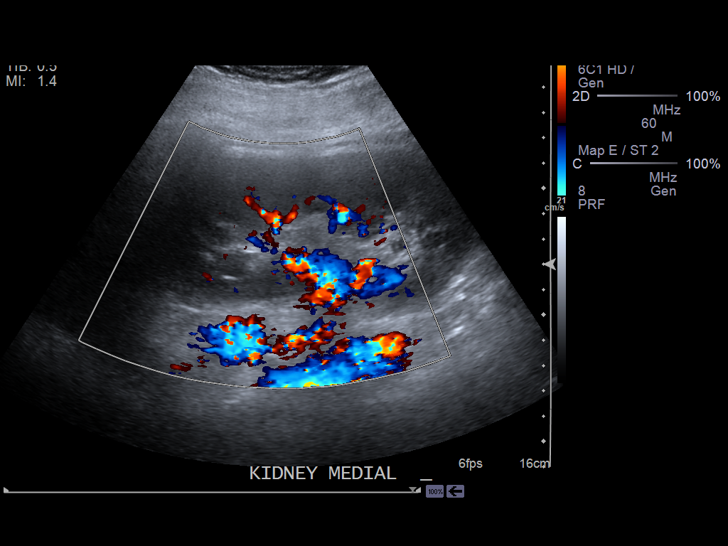
[im 21/32]
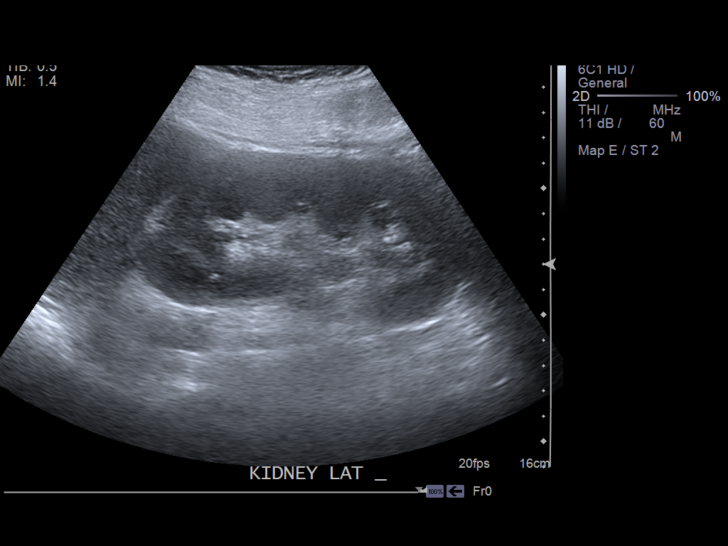
[im 24/32]
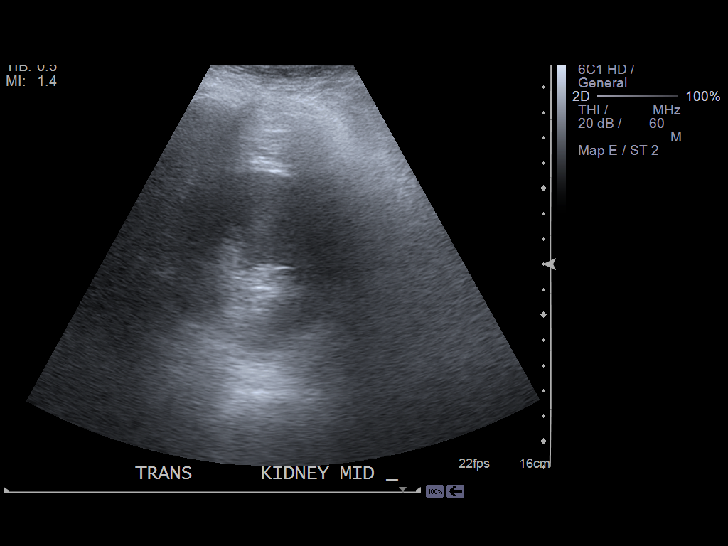
[im 26/32]
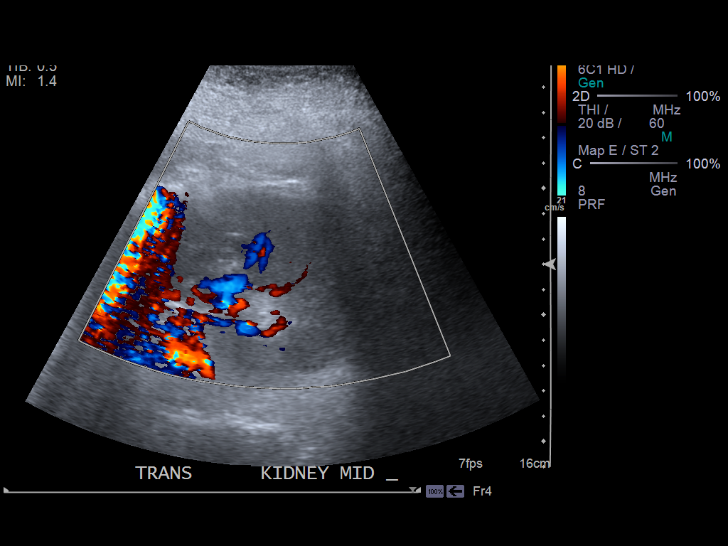
[im 29/32]
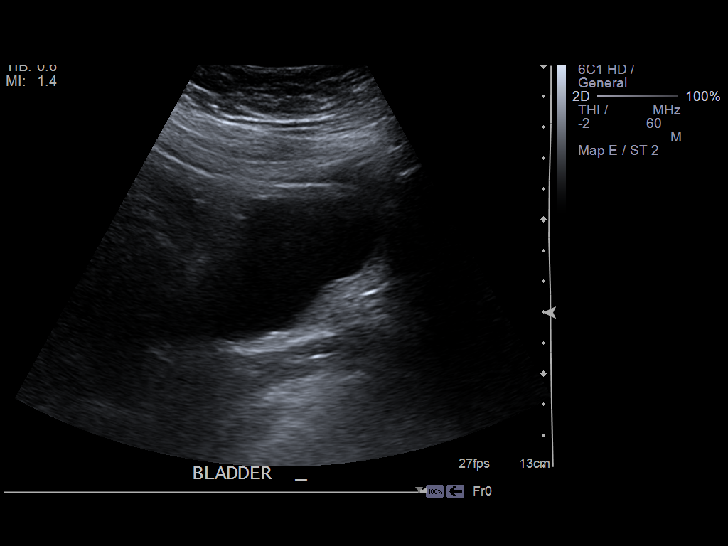
[im 32/32]
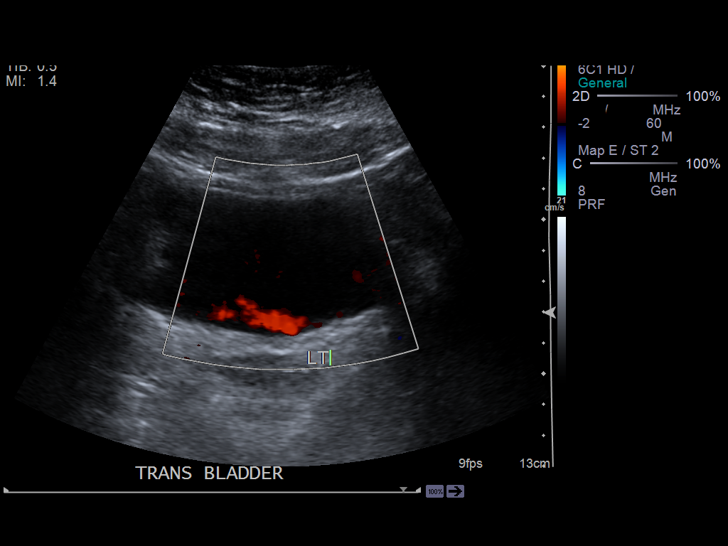

[14 of 25 positions shown; findings below may reference images not displayed]

FINDINGS: The right kidney measures 13.09 x 7.25 x 6.29 cm and the left
13.09 x 7.16  x 7.91 cm. The study is degraded secondary to patient's body
habitus. The patient is morbidly obese.

Within the limitations of the study, there is appropriate corticomedullary
differentiation without evidence of hydronephrosis, solid or cystic masses,
or calculi. Bilateral ureteral jets are identified.
IMPRESSION: Limited unremarkable bilateral Renal Ultrasound.

## 2015-02-09 NOTE — Consult Note (Signed)
Chief Complaint:  Subjective/Chief Complaint no acute complaints, cough clear mucous no sob, tightness in back, no pleuritic pain, no chills   VITAL SIGNS/ANCILLARY NOTES: **Vital Signs.:   26-Jul-14 09:30  Vital Signs Type Q 4hr  Temperature Temperature (F) 98.1  Celsius 36.7  Temperature Source oral  Pulse Pulse 83  Respirations Respirations 20  Systolic BP Systolic BP 229  Diastolic BP (mmHg) Diastolic BP (mmHg) 85  Mean BP 99  Pulse Ox % Pulse Ox % 97  Pulse Ox Activity Level  At rest  Oxygen Delivery Room Air/ 21 %   Brief Assessment:  GEN well developed   Respiratory no use of accessory muscles   Additional Physical Exam alert and cooperastive  neuro grossly non focal   Lab Results: Routine Chem:  26-Jul-14 04:05   Result Comment WBC - RESULTS VERIFIED BY REPEAT TESTING.  - CRITICAL VALUE PREVIOUSLY NOTIFIED. DIFFERENTIAL - DUE TO THE LOW WBC, THE INSTRUMENT DIFF  - CANNOT BE CONFIRMED BY MANUAL DIFF AND  - IS REPORTED PRIMARILY TO IDENTIFY CELL  - TYPES PRESENT.  Result(s) reported on 14 May 2013 at 05:17AM.  Routine Hem:  26-Jul-14 04:05   WBC (CBC)  0.8  RBC (CBC)  3.47  Hemoglobin (CBC)  8.5  Hematocrit (CBC)  25.7  Platelet Count (CBC) 218  MCV  74  MCH  24.6  MCHC 33.3  RDW  15.2  Neutrophil % 58.4  Lymphocyte % 25.2  Monocyte % 2.4  Eosinophil % 12.4  Basophil % 1.6  Neutrophil #  0.5  Lymphocyte #  0.2  Monocyte #  0.0  Eosinophil # 0.1  Basophil # 0.0   Assessment/Plan:  Assessment/Plan:  Assessment SEE ALSO INITIAL CONSULT.......1. HODGKINS DISEASE RECURRENT,  S/P RITUXAN, GEMZAR, OXALAPLATIN, 7/8 AND 7/22.. 2. NEUTROPENIA, PNEUMONIA, PLTS OK, NEUTROPENIA CAN BE RESPONSE PROGRESSIVE FROM CHEMOTX, DOES NOT LOOK LIKE SEPSIS OR BONE MARROW FAILURE  3. FEVER DOWN AND BLOOD CULTURES NEGATIVE TO DATE  4. 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 DISCUSSED WITH NP MEAGAN FUCHS AT [facility], REVIEWED RECORD, IS NOT ALLERGIC TO  NEULASTA, HAD TOLERATED NEULASTA SUBSEQUENT TO PATIENT S REPORTED ADVERSE RX...Marland KitchenMarland KitchenIF WBC LOWER OR IF FEVER RETURNS CAN USE DAILY NEUPOGEN, BUT NOT INDICATED CURRENTLY... CONTINUE ANTIBIOTICS, F/U CXR, F/U CBC, WATCH FEVER   Electronic Signatures: Dallas Schimke (MD)  (Signed 26-Jul-14 11:14)  Authored: Chief Complaint, VITAL SIGNS/ANCILLARY NOTES, Brief Assessment, Lab Results, Assessment/Plan   Last Updated: 26-Jul-14 11:14 by Dallas Schimke (MD)

## 2015-02-09 NOTE — Consult Note (Signed)
Brief Consult Note: Diagnosis: fever, pneumonia, hodgkins disease.   Patient was seen by consultant.   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 cbc was normal on 7/22, on admission 7/24 and today neutropenia, due to infection, ttoo soon to be chemotx...Marland KitchenMarland KitchenExanm unrmarkable, vss, temp down, on levaquin, vanco, zosyn....PLAN...NO CHANGES.....BUT NOTE NO GROWTH FACTORS AS ALLERGIC TO NEULASTA.  F/U CBC DAILY.  Electronic Signatures: Dallas Schimke (MD)  (Signed 25-Jul-14 13:24)  Authored: Brief Consult Note   Last Updated: 25-Jul-14 13:24 by Dallas Schimke (MD)

## 2015-02-09 NOTE — Consult Note (Signed)
Chief Complaint:  Subjective/Chief Complaint nausea   VITAL SIGNS/ANCILLARY NOTES: **Vital Signs.:   30-Jul-14 17:50  Vital Signs Type Q 4hr  Temperature Temperature (F) 98.7  Celsius 37  Temperature Source oral  Pulse Pulse 75  Respirations Respirations 20  Systolic BP Systolic BP 341  Diastolic BP (mmHg) Diastolic BP (mmHg) 85  Mean BP 101  Pulse Ox % Pulse Ox % 100  Pulse Ox Activity Level  At rest  Oxygen Delivery Room Air/ 21 %   Brief Assessment:  Cardiac Regular   Gastrointestinal details normal Soft  Nontender  Nondistended   Additional Physical Exam alert and cooperative neuro non focal   Lab Results:  Hepatic:  30-Jul-14 09:11   Bilirubin, Total 0.3  Alkaline Phosphatase  171  SGPT (ALT) 45  SGOT (AST)  66  Total Protein, Serum  6.0  Albumin, Serum  2.3  LabUnknown:  30-Jul-14 05:14   Result Interpretation Blood smear reviewed. There is moderate thrombocytopenia without clumping. Microcytic anemia is present, with a few nucleated RBCs noted. Neutropenia is present, but monocytes are increased. No abnormal or immature leukocytes are seen. The historyof recent chemotherapy is noted. Eileen Pais, MD.  Routine Chem:  30-Jul-14 05:14   Result Comment NRBCS - PATHOLOGIST TO REVIEW SMEAR. COMMENTS  - APPEAR ON REPORT WHEN COMPLETE.  Result(s) reported on 18 May 2013 at 07:20AM.    09:11   Glucose, Serum  150  BUN 8  Creatinine (comp)  2.35  Sodium, Serum 140  Potassium, Serum  3.3  Chloride, Serum 105  CO2, Serum 26  Calcium (Total), Serum 9.3  Osmolality (calc) 281  eGFR (African American)  30  eGFR (Non-African American)  26 (eGFR values <12mL/min/1.73 m2 may be an indication of chronic kidney disease (CKD). Calculated eGFR is useful in patients with stable renal function. The eGFR calculation will not be reliable in acutely ill patients when serum creatinine is changing rapidly. It is not useful in  patients on dialysis. The eGFR calculation may not  be applicable to patients at the low and high extremes of body sizes, pregnant women, and vegetarians.)  Anion Gap 9  Routine Hem:  30-Jul-14 05:14   WBC (CBC)  2.2  RBC (CBC)  3.78  Hemoglobin (CBC)  9.3  Hematocrit (CBC)  28.1  Platelet Count (CBC)  85  MCV  75  MCH  24.7  MCHC 33.2  RDW  15.7  Segmented Neutrophils 33  Lymphocytes 15  Monocytes 46  Eosinophil 6  NRBC 3  Diff Comment 1 ANISOCYTOSIS  Diff Comment 2 POIKILOCYTOSIS  Diff Comment 3 PLTS VARIED IN SIZE  Result(s) reported on 18 May 2013 at 07:20AM.   Assessment/Plan:  Assessment/Plan:  Assessment HODGINS DISEASE, S/P [place] 7/8 AND 7/22. PNEUMONIA. NEUTROPENIA ATTRIBUTED TO PRIOR CHEMOTX AND INFECTION, HAS BEEN AFEBRILE RECENTLY, NEUTROPHILS SLOWLY IMPROVING, HIGH MONOCYTE COUNT TODAY LOOKS LIKE POLYS SET FOR REBOUND. PLTS STEADILY LOWER, TIME COURSE CONSISTENT WITH PRIOR CHEMOTX, NO REASON TO SUSPECT HEPARIN, BUT AGREE TO HOLD HEPARIN AS PATIENT IS MOBILE, CAN ALSO CHECK HEPARIN ANTIBODIES.  NEW PROBLEM RISING CREATININE, MOST LIKELY FROM ANTIBIOTICS, IV ABS HAVE BEEN STOPPED, IF FLUID RESTARTED, DISCUSSED WITH ATTENDING, AGREE TO CONSULT NEPHROLOGY IF CRE CONTINUES TO RISE. K SLIGHTLY LOW. SHOULD CHECK MG, CSN DROP AFTER OXALAPLATIN. SMEAR NO MICROANGIOPATHY, HGB HOLDING, SO NO EVIDENCE OF TTP/HUS WHICH CAN FOLLOW GEMZAR. WILL RECHECK CBC, CRE, AND LDH   Electronic Signatures: Dallas Schimke (MD)  (Signed 30-Jul-14 18:20)  Authored: Chief Complaint, VITAL SIGNS/ANCILLARY NOTES, Brief Assessment, Lab Results, Assessment/Plan   Last Updated: 30-Jul-14 18:20 by Dallas Schimke (MD)

## 2015-02-09 NOTE — Discharge Summary (Signed)
PATIENT NAME:  Eileen Evans, Eileen Evans MR#:  [id number] DATE OF BIRTH:  Aug 04, 1981  DATE OF ADMISSION:  05/12/2013 DATE OF DISCHARGE:  05/23/2013  ADMITTING PHYSICIAN: Nicholes Mango, MD  DISCHARGING PHYSICIAN:  Gladstone Lighter, MD  PRIMARY ONCOLOGIST: At [facility] oncology.  CONSULTATIONS IN THE HOSPITAL:  1.  Oncology by Dr. Barbette Reichmann. 2.  Nephrology by Dr. Anthonette Legato, Dr. Murlean Iba and Dr. Lavonia Dana.    DISCHARGE DIAGNOSES: 1.  Sepsis.  2.  Bilateral pneumonia.  3.  Neutropenic fever.  4.  Acute renal failure from acute tubular necrosis.  5.  Pancytopenia secondary to recent chemotherapy. 6.  Hodgkin lymphoma.  DISCHARGE HOME MEDICATIONS:  1.  Ativan 0.5 mg p.o. t.i.d. as needed for anxiety.  2.  Claritin 10 mg p.o. daily.  3.  OxyContin 10 mg p.o. b.i.d.  4.  Augmentin 875 mg p.o. b.i.d. for 2 more days.  5.  Compazine 10 mg p.o. q. 6 hours p.r.n. for nausea and vomiting.   DISCHARGE DIET: Regular diet.   DISCHARGE ACTIVITY: As tolerated.  FOLLOWUP INSTRUCTIONS: 1.  Basic metabolic panel follow-up in 3 to 4 days.  2.  Advised to drink lots of p.o. fluids.  3.  Oncology follow-up in 1 week.   LABS AND IMAGING STUDIES:  Prior to discharge:  Sodium 140, potassium 3.5, chloride 109, bicarb 25, BUN 7, creatinine 2, glucose 126 and calcium 8.7. Stool for C. diff is negative. WBC on 05/22/2013 is 2.7 with 36% neutrophils, 12% lymphocytes, 49% monocytes. Hemoglobin 8.8, hematocrit 26.2, platelet count 170. Ultrasound of kidneys bilaterally showing normal kidney size and no acute abnormality detected. The patient's chest x-ray on admission revealing atelectasis versus infiltrate in the lingula.  Troponins were negative. Blood culture on admission was negative. CT of the chest with contrast showing left lower lobe consolidation concerning for pneumonia, bilateral pulmonary nodules are present. LFTs were within normal limits, except an albumin of 2.5 on admission.  Sputum culture is  only growing light growth of candida.  BRIEF HOSPITAL COURSE: [name] is a 34 year old female with recent diagnosis of Hodgkin lymphoma being followed at [facility], started on chemotherapy, presented to the hospital a couple of days after her second cycle of chemo for neutropenic fevers.  1.  Sepsis with neutropenic fever, likely secondary to left lower lobe pneumonia as noted on her CT chest.  All her cultures have remained negative. The patient was on broad-spectrum antibiotics including vanc, Zosyn and Levaquin initially and after the cultures were turned negative she was changed over to Augmentin. The patient has had a couple of low-grade fevers, however, recovered without receiving any Tylenol.  Most of the time she keeps saying that she was bundled up in covers.  No repeat cultures were ordered during these episodes. The patient started symptomatically improving, feeling better, and not sure of the low-grade fever of 100.1 degrees Fahrenheit, a couple of episodes while on the antibiotics.  Could be secondary to her lymphoma or body temperature being elevated as she was in a lot of blankets, wrapped up.  Her antibiotics were stopped and changed over to p.o. Augmentin and will finish off her course in 2 more days.  2.  Neutropenia/pancytopenia. Secondary to chemotherapy, bone marrow depression. She has not received any Neulasta during this admission. White count recovered and improved up to 2.7.  Even platelets dropped and recovered now, and she has not received any platelet or red blood cell transfusions.  Chemo was due today, on 05/23/2013, according to the  patient, but will follow up with her oncologist next week.  3.  Acute renal failure, likely secondary to ATN secondary to her sepsis on admission, receiving antibiotics like vancomycin in the hospital and also contrast exposure on admission. She had a CT chest done with contrast.  Renal function was completely normal at the time of admission at 0.94  creatinine, however, it worsened up to 2.45 and started improving and at the time of discharge is 2.08. The patient lost her IV access and has refused another IV line placement for IV fluids because she is a hard stick. The patient has kept up with her oral intake of fluids and it was recommended to drink lots of fluids and has a follow-up basic metabolic panel ordered in 4 days. She was seen by nephrology, had a renal ultrasound in the hospital, which was completely normal, so is being discharged home.  4.  Hodgkin lymphoma. Advised to follow up with [facility].    Her course has been otherwise uneventful other than development of chemo-related nausea and vomiting, which resolved with Compazine, Zofran and Ativan p.r.n. She is feeling fine, ambulating well without any complaints at this time, so is being discharged home.   DISCHARGE CONDITION: Stable.   DISCHARGE DISPOSITION: Home.   TIME SPENT ON DISCHARGE: 40 minutes. ____________________________ Gladstone Lighter, MD rk:sb D: 05/23/2013 13:41:15 ET T: 05/23/2013 15:35:56 ET JOB#: [id number]  cc: Gladstone Lighter, MD, <Dictator> Gladstone Lighter MD ELECTRONICALLY SIGNED 05/24/2013 14:34

## 2015-02-09 NOTE — H&P (Signed)
PATIENT NAME:  Eileen Evans, Eileen Evans MR#:  [id number] DATE OF BIRTH:  1981-03-27  DATE OF ADMISSION:  05/12/2013  PRIMARY CARE PHYSICIAN: Nonlocal. Located at [facility].   REFERRING PHYSICIAN: Dr. Owens Shark.   CHIEF COMPLAINT: Fever, tachycardia, neutropenia and pneumonia.   HISTORY OF PRESENT ILLNESS: The patient is a 34 year old African American female with a recent diagnosis of Hodgkin's lymphoma on her second cycle of chemotherapy which was done just yesterday at [facility], is presenting to the ER with upper back pain and coughing with greenish-yellow phlegm. The patient is febrile at 102 degrees Fahrenheit in the ER and tachycardic too. Heart rate is at around 125 and the patient is febrile with a temperature of 102 degrees Fahrenheit. CT angiogram of the chest showed left lower lobe consolidation. The patient was placed on neutropenic precaution and cultures were obtained. Subsequently IV antibiotics were initiated with IV levofloxacin. The patient is sick-looking, but denies any chest pain or shortness of breath. Her husband is at bedside. No similar complaints in the past.   PAST MEDICAL HISTORY: Hodgkin's lymphoma on chemotherapy.   PAST SURGICAL HISTORY: Port-A-Cath placement.   ALLERGIES: SHE IS ALLERGIC TO, LATEX.   PSYCHOSOCIAL HISTORY: Lives at home with husband. No history of smoking, alcohol or illicit drug usage.   FAMILY HISTORY: Grand-mom diagnosed with diabetes mellitus. Mother had history of leukemia. Dad deceased with colon cancer.   HOME MEDICATIONS: OxyContin 10 mg p.o. q. 12 hours, lorazepam 0.5 mg 3 times a day, Claritin 10 mg once daily.  REVIEW OF SYSTEMS: CONSTITUTIONAL: Positive fever, complaining of fatigue and weakness. Denies any weight less or weight gain.  EYES: Denies blurry vision, glaucoma.  ENT: Denies epistaxis or ear pain.  RESPIRATION: Complaining of a productive cough with greenish-yellow phlegm. Denies COPD.  CARDIOVASCULAR: No chest pain. No  palpitations.  GASTROINTESTINAL: Denies nausea, vomiting, diarrhea.  GENITOURINARY: No dysuria or hematuria.  GYNECOLOGIC: Denies breast mass or vaginal discharge.  ENDOCRINE: Denies polyuria, nocturia.  HEMATOLOGIC AND LYMPHATIC: Denies any anemia or easy bruising, but the patient neutropenic.  INTEGUMENTARY: No acne, rash.  MUSCULOSKELETAL: No joint pain in the shoulders. Complaining of back pain. Denies gout.  NEUROLOGIC: Denies vertigo, ataxia.  PSYCHIATRIC: Denies ADD, OCD.   PHYSICAL EXAMINATION: VITAL SIGNS: Temperature 102.3, pulse 121. Blood pressure 122/69, pulse ox 92%.  GENERAL APPEARANCE: Not in acute distress. Moderately built and obese.  HEENT: Normocephalic, atraumatic. Pupils are equally reactive to light and accommodation. No scleral icterus. No sinus tenderness. No postnasal drip.  NECK: Supple. No JVD. Range of motion is intact.  LUNGS: Moderate air entry. Decreased breath sounds with crackles on the left side. No accessory muscle use and no anterior chest wall tenderness on palpation.  CARDIAC: S1, S2 normal. Regular rate and rhythm, tachycardic. Anterior chest wall Port-A-Cath is intact.  GASTROINTESTINAL: Soft. Bowel sounds are positive in all four quadrants. Obese, nontender, nondistended. No masses felt. No hepatosplenomegaly.  NEUROLOGIC: Awake, alert, oriented x3. Cranial nerves II through XII are grossly intact. Motor and sensory are intact. Reflexes are 2+.  EXTREMITIES: No edema. No cyanosis. No clubbing.   LABS AND IMAGING STUDIES: CT angiogram of the chest has revealed multiple pulmonary nodules which may be from the patient's history of lymphoma, left lower lobe consolidation which may represent an early left lower lobe pneumonia.  A 12-lead EKG has revealed sinus tachycardia and nonspecific ST wave changes. Normal PR and QRS interval. Troponin less than 0.02. WBC 1.9, hemoglobin 11.0, hematocrit 35.0, platelets 293, MCV  76. D-dimer 1.26. Chem-8 is normal.    ASSESSMENT AND PLAN: A 33 year old female with a history of Hodgkin's lymphoma status post second cycle of chemotherapy just yesterday, presenting with fever and productive cough, will be admitted with the following assessment and plan.   1.  Sepsis from left lower lobe consolidation with pneumonia. Pan cultures were obtained. The patient was given IV levofloxacin, will continue Zosyn, Levaquin and vancomycin.  2.  Neutropenia, probably from chemotherapy. The patient will be on neutropenic precautions.  3.  Hodgkin's lymphoma on chemo at [facility].  4.  Sinus tachycardia, probably from fever. We will provide Tylenol and IV fluids.   SHE IS FULL CODE. Husband is the medical power of attorney.   TOTAL TIME SPENT ON ADMISSION: 50 minutes.    Diagnosis and plan of care were discussed in detail with the patient. She verbalized understanding of the plan.    ____________________________ Nicholes Mango, MD ag:dp D: 05/12/2013 06:33:46 ET T: 05/12/2013 06:47:55 ET JOB#: [id number]  cc: Nicholes Mango, MD, <Dictator> Nicholes Mango MD ELECTRONICALLY SIGNED 05/15/2013 7:06
